# Patient Record
Sex: Female | Born: 1945 | Race: White | Hispanic: No | Marital: Married | Smoking: Never smoker
Health system: Southern US, Community
[De-identification: ages and names within clinical notes are randomized; demographics above are authoritative.]

## PROBLEM LIST (undated history)

## (undated) DIAGNOSIS — K259 Gastric ulcer, unspecified as acute or chronic, without hemorrhage or perforation: Secondary | ICD-10-CM

## (undated) DIAGNOSIS — G43909 Migraine, unspecified, not intractable, without status migrainosus: Secondary | ICD-10-CM

## (undated) DIAGNOSIS — S62109A Fracture of unspecified carpal bone, unspecified wrist, initial encounter for closed fracture: Secondary | ICD-10-CM

## (undated) HISTORY — PX: CATARACT EXTRACTION: SUR2

## (undated) HISTORY — DX: Migraine, unspecified, not intractable, without status migrainosus: G43.909

## (undated) HISTORY — DX: Fracture of unspecified carpal bone, unspecified wrist, initial encounter for closed fracture: S62.109A

---

## 2002-10-14 ENCOUNTER — Ambulatory Visit (HOSPITAL_COMMUNITY): Admission: RE | Admit: 2002-10-14 | Discharge: 2002-10-14 | Payer: Self-pay | Admitting: Gastroenterology

## 2013-12-05 DIAGNOSIS — N952 Postmenopausal atrophic vaginitis: Secondary | ICD-10-CM | POA: Insufficient documentation

## 2015-04-12 ENCOUNTER — Encounter: Payer: Self-pay | Admitting: Physical Therapy

## 2015-04-12 ENCOUNTER — Ambulatory Visit: Payer: Medicare HMO | Attending: Orthopedic Surgery | Admitting: Physical Therapy

## 2015-04-12 DIAGNOSIS — M62838 Other muscle spasm: Secondary | ICD-10-CM

## 2015-04-12 DIAGNOSIS — M6248 Contracture of muscle, other site: Secondary | ICD-10-CM | POA: Diagnosis present

## 2015-04-12 DIAGNOSIS — M542 Cervicalgia: Secondary | ICD-10-CM | POA: Insufficient documentation

## 2015-04-12 DIAGNOSIS — M5030 Other cervical disc degeneration, unspecified cervical region: Secondary | ICD-10-CM

## 2015-04-12 NOTE — Therapy (Signed)
[facility] [address] [place], [place], [address] Phone: [phone]   Fax:  [phone]  Physical Therapy Evaluation  Patient Details  Name: Kelsey Webb MRN: [id number] Date of Birth: [birth year] Referring Provider: Ranell Patrick  Encounter Date: 04/12/2015      PT End of Session - 04/12/15 1004    Visit Number 1   Date for PT Re-Evaluation 06/10/15   PT Start Time 0925   PT Stop Time 1020   PT Time Calculation (min) 55 min   Activity Tolerance Patient tolerated treatment well   Behavior During Therapy [facility] for tasks assessed/performed      History reviewed. No pertinent past medical history.  History reviewed. No pertinent past surgical history.  There were no vitals filed for this visit.  Visit Diagnosis:  Neck pain - Plan: PT plan of care cert/re-cert  Trapezius muscle spasm - Plan: PT plan of care cert/re-cert  DDD (degenerative disc disease), cervical - Plan: PT plan of care cert/re-cert      Subjective Assessment - 04/12/15 0927    Subjective Reports that her x-rays show DDD, and spurs.  Patient reports that she likes to garden and got a job in a [employer] last summer and really feels like she over did it and has had some pain since.   Limitations Sitting;Reading;House hold activities   Diagnostic tests DDD, spurs   Patient Stated Goals have less pain   Currently in Pain? Yes   Pain Score 3    Pain Location Neck   Pain Orientation Right;Upper   Pain Descriptors / Indicators Aching;Spasm;Sore   Pain Type Chronic pain   Pain Onset More than a month ago   Pain Frequency Constant   Aggravating Factors  increased activity, pain up to 8-9/10   Pain Relieving Factors reports she has found nothing that helps pain            [facility] PT Assessment - 04/12/15 0001    Assessment   Medical Diagnosis neck pain   Referring Provider Ranell Patrick   Onset Date/Surgical Date 04/02/15   Hand Dominance Right   Prior Therapy none   Precautions   Precautions None   Balance Screen   Has the patient fallen in the past 6 months No   Has the patient had a decrease in activity level because of a fear of falling?  No   Is the patient reluctant to leave their home because of a fear of falling?  No   Home Environment   Additional Comments does housework and yardwork   Prior Function   Level of Independence Independent   Vocation Retired   Leisure love to garden, was doing some exercise until December   Posture/Postural Control   Posture Comments fwd head, rounded shoulders   AROM   Overall AROM Comments Cervical ROM 50% decreased for flexion and side bending, rotation was decreased 25%, extension WNL's, shoulder ROM WNL's   Strength   Overall Strength Comments 4/5 for the shoulders   Flexibility   Soft Tissue Assessment /Muscle Length --  mild tightness in the right UE neural tension   Palpation   Palpation comment she is very tight and tender in the right cervical area and into the right upper trap and rhomboid   Special Tests    Special Tests --  less pain with manual cervical distraction  [facility] Adult PT Treatment/Exercise - 2015/04/21 0001    Modalities   Modalities Moist Heat;Electrical Stimulation   Moist Heat Therapy   Number Minutes Moist Heat 15 Minutes   Moist Heat Location Cervical   Electrical Stimulation   Electrical Stimulation Location right cervical area   Electrical Stimulation Action IFC   Electrical Stimulation Parameters supine   Electrical Stimulation Goals Pain                PT Education - 21-Apr-2015 1003    Education provided Yes   Education Details shrugs, scapular and cervical retraction   Person(s) Educated Patient   Methods Explanation;Demonstration;Handout   Comprehension Verbalized understanding;Returned demonstration          PT Short Term Goals - 04-21-2015 1051    PT SHORT TERM GOAL #1   Title independent with  initial HEP   Time 1   Period Weeks   Status New           PT Long Term Goals - April 21, 2015 1051    PT LONG TERM GOAL #1   Title report 50% less pain   Time 8   Period Weeks   Status New   PT LONG TERM GOAL #2   Title have 25% increased ROM   Time 8   Period Weeks   Status New   PT LONG TERM GOAL #3   Title return to the gym safely   Time 8   Period Weeks   Status New   PT LONG TERM GOAL #4   Title report less HA   Time 8   Period Weeks   Status New               Plan - 2015-04-21 1009    Clinical Impression Statement Patient with right side neck pain since overdoing it lifting last summer, x-rays show DDD and some bone spurs.  She has pain up to the occiput, very tight in the mms of the cervical area, the upper trap and into the rhomboids.  Manual cervical distraction decreased the pain   Pt will benefit from skilled therapeutic intervention in order to improve on the following deficits Decreased range of motion;Decreased strength;Increased muscle spasms;Postural dysfunction;Improper body mechanics;Pain   Rehab Potential Good   PT Frequency 2x / week   PT Duration 8 weeks   PT Treatment/Interventions Moist Heat;Therapeutic exercise;Manual techniques;Taping;Electrical Stimulation;Cryotherapy;Patient/family education;Ultrasound;Traction   PT Next Visit Plan may try traction   Consulted and Agree with Plan of Care Patient          G-Codes - 04-21-15 1053    Functional Assessment Tool Used foto 62% limitation   Functional Limitation Carrying, moving and handling objects   Carrying, Moving and Handling Objects Current Status (Z6109) At least 60 percent but less than 80 percent impaired, limited or restricted   Carrying, Moving and Handling Objects Goal Status (U0454) At least 40 percent but less than 60 percent impaired, limited or restricted       Problem List There are no active problems to display for this patient.   Jearld Lesch., PT 04-21-15,  10:55 AM  [facility] [address] [place], [place], [address] Phone: [phone]   Fax:  [phone]  Name: Kelsey Webb MRN: [id number] Date of Birth: 03-May-1945

## 2015-04-19 ENCOUNTER — Ambulatory Visit: Payer: Medicare HMO | Admitting: Physical Therapy

## 2015-04-19 ENCOUNTER — Encounter: Payer: Self-pay | Admitting: Physical Therapy

## 2015-04-19 DIAGNOSIS — M62838 Other muscle spasm: Secondary | ICD-10-CM

## 2015-04-19 DIAGNOSIS — M542 Cervicalgia: Secondary | ICD-10-CM | POA: Diagnosis not present

## 2015-04-19 NOTE — Therapy (Signed)
[facility] [place] [address] [place], [place], [address] Phone: [phone]   Fax:  [phone]  Physical Therapy Treatment  Patient Details  Name: Kelsey Webb MRN: [id number] Date of Birth: 01-01-1946 Referring Provider: Ranell Patrick  Encounter Date: 04/19/2015      PT End of Session - 04/19/15 1006    Visit Number 2   Date for PT Re-Evaluation 06/10/15   PT Start Time 0930   PT Stop Time 1012   PT Time Calculation (min) 42 min      History reviewed. No pertinent past medical history.  History reviewed. No pertinent past surgical history.  There were no vitals filed for this visit.  Visit Diagnosis:  Neck pain  Trapezius muscle spasm      Subjective Assessment - 04/19/15 0924    Subjective theraband HEP really increases pain, other ex are okay   Currently in Pain? Yes   Pain Score 3    Pain Location Neck   Pain Orientation Right                         [facility] Adult PT Treatment/Exercise - 04/19/15 0001    Exercises   Exercises Neck   Neck Exercises: Machines for Strengthening   UBE (Upper Arm Bike) 2 fwd/2 back   Neck Exercises: Standing   Neck Retraction 10 reps;3 secs  with ball on wall   Wall Push Ups 10 reps  ball vs wall 5 CC and CW   Other Standing Exercises postural reset 10 times   Electrical Stimulation   Electrical Stimulation Location right cervical area/UT/rhom   Electrical Stimulation Action combo   Manual Therapy   Manual Therapy Manual Traction;Joint mobilization;Soft tissue mobilization;Passive ROM;Taping   Manual therapy comments kinesio tape to creat space   Soft tissue mobilization UT/rhom/cerv   Passive ROM cerv ROM   Manual Traction cerv                PT Education - 04/19/15 1006    Education provided Yes   Education Details D/C tband,work on postural reste and stab\   Person(s) Educated Patient   Methods Explanation;Demonstration   Comprehension  Verbalized understanding;Returned demonstration          PT Short Term Goals - 04/19/15 1008    PT SHORT TERM GOAL #1   Title independent with initial HEP   Status Achieved           PT Long Term Goals - 04/12/15 1051    PT LONG TERM GOAL #1   Title report 50% less pain   Time 8   Period Weeks   Status New   PT LONG TERM GOAL #2   Title have 25% increased ROM   Time 8   Period Weeks   Status New   PT LONG TERM GOAL #3   Title return to the gym safely   Time 8   Period Weeks   Status New   PT LONG TERM GOAL #4   Title report less HA   Time 8   Period Weeks   Status New               Plan - 04/19/15 1007    Clinical Impression Statement increased tightness and trigger pts in UT and rhom,responded well to STW and combo-trial of tape. Adjusted HEP   PT Next Visit Plan assess and progress,possible dry needle rec if no changes overall  Problem List There are no active problems to display for this patient.   Sita Mangen,ANGIE PTA 04/19/2015, 10:09 AM  [facility] [place] [address] [place], [place], [address] Phone: [phone]   Fax:  [phone]  Name: Trent Theisen MRN: [id number] Date of Birth: 02-20-1946

## 2015-04-26 ENCOUNTER — Ambulatory Visit: Payer: Medicare HMO | Attending: Orthopedic Surgery | Admitting: Physical Therapy

## 2015-04-26 ENCOUNTER — Encounter: Payer: Self-pay | Admitting: Physical Therapy

## 2015-04-26 DIAGNOSIS — M542 Cervicalgia: Secondary | ICD-10-CM | POA: Insufficient documentation

## 2015-04-26 DIAGNOSIS — M5030 Other cervical disc degeneration, unspecified cervical region: Secondary | ICD-10-CM

## 2015-04-26 DIAGNOSIS — M62838 Other muscle spasm: Secondary | ICD-10-CM

## 2015-04-26 DIAGNOSIS — M6248 Contracture of muscle, other site: Secondary | ICD-10-CM | POA: Diagnosis present

## 2015-04-26 NOTE — Therapy (Signed)
[facility] [place] [address] [place], [place], [address] Phone: [phone]   Fax:  [phone]  Physical Therapy Treatment  Patient Details  Name: Kelsey MillerChristina Berwanger MRN: [id number] Date of Birth: 12/02/1945 Referring Provider: Ranell PatrickNorris  Encounter Date: 04/26/2015      PT End of Session - 04/26/15 1010    Visit Number 3   Date for PT Re-Evaluation 06/10/15   PT Start Time 0930   PT Stop Time 1025   PT Time Calculation (min) 55 min   Activity Tolerance Patient tolerated treatment well   Behavior [facility] Therapy [facility] for tasks assessed/performed      History reviewed. No pertinent past medical history.  History reviewed. No pertinent past surgical history.  There were no vitals filed for this visit.  Visit Diagnosis:  Neck pain  Trapezius muscle spasm  DDD (degenerative disc disease), cervical      Subjective Assessment - 04/26/15 0930    Subjective "It is a working progress" Pt stated that the Tape really helped last time   Currently in Pain? Yes   Pain Score 4    Pain Location Neck   Pain Orientation Right                         [facility] Adult PT Treatment/Exercise - 04/26/15 0001    Exercises   Exercises Neck   Neck Exercises: Machines for Strengthening   UBE (Upper Arm Bike) 3 fwd/3 back   Neck Exercises: Standing   Wall Push Ups 10 reps   Other Standing Exercises Ball on wall CW/CCW x10; Shrugs #2 2x10   Other Standing Exercises OHP with red ball. 2x10   Neck Exercises: Seated   Other Seated Exercise Rows #10 2x10   Other Seated Exercise Lat #15 2x10    Neck Exercises: Supine   Other Supine Exercise Cerv retraction 3 way 2x10   Modalities   Modalities Moist Heat;Electrical Stimulation   Moist Heat Therapy   Number Minutes Moist Heat 15 Minutes   Moist Heat Location Cervical   Electrical Stimulation   Electrical Stimulation Location right cervical   Electrical Stimulation Action IFC    Electrical Stimulation Goals Pain   Manual Therapy   Manual Therapy Passive ROM;Soft tissue mobilization   Manual therapy comments kinesio tape to creat space   Soft tissue mobilization UT/rhom/cerv   Passive ROM cerv ROM   Manual Traction cerv                  PT Short Term Goals - 04/19/15 1008    PT SHORT TERM GOAL #1   Title independent with initial HEP   Status Achieved           PT Long Term Goals - 04/12/15 1051    PT LONG TERM GOAL #1   Title report 50% less pain   Time 8   Period Weeks   Status New   PT LONG TERM GOAL #2   Title have 25% increased ROM   Time 8   Period Weeks   Status New   PT LONG TERM GOAL #3   Title return to the gym safely   Time 8   Period Weeks   Status New   PT LONG TERM GOAL #4   Title report less HA   Time 8   Period Weeks   Status New  Plan - 04/26/15 1011    Clinical Impression Statement Completed exercises well, did not attempt Tband exercises but did perform machine exercises well. No increase in pain with exercises.    Pt will benefit from skilled therapeutic intervention in order to improve on the following deficits Decreased range of motion;Decreased strength;Increased muscle spasms;Postural dysfunction;Improper body mechanics;Pain   Rehab Potential Good   PT Frequency 2x / week   PT Duration 8 weeks   PT Treatment/Interventions Moist Heat;Therapeutic exercise;Manual techniques;Taping;Electrical Stimulation;Cryotherapy;Patient/family education;Ultrasound;Traction   PT Next Visit Plan assess and progress        Problem List There are no active problems to display for this patient.   Grayce Sessions, PTA 04/26/2015, 10:16 AM  [facility] [place] [address] [place], [place], [address] Phone: [phone]   Fax:  [phone]  Name: Carmina Walle MRN: [id number] Date of Birth: 1945-12-31

## 2015-05-03 ENCOUNTER — Ambulatory Visit: Payer: Medicare HMO | Admitting: Physical Therapy

## 2015-05-03 ENCOUNTER — Encounter: Payer: Self-pay | Admitting: Physical Therapy

## 2015-05-03 DIAGNOSIS — M542 Cervicalgia: Secondary | ICD-10-CM | POA: Diagnosis not present

## 2015-05-03 DIAGNOSIS — M5030 Other cervical disc degeneration, unspecified cervical region: Secondary | ICD-10-CM

## 2015-05-03 DIAGNOSIS — M62838 Other muscle spasm: Secondary | ICD-10-CM

## 2015-05-03 NOTE — Therapy (Signed)
[place], [place], [address] Phone: [phone]   Fax:  [phone]  Physical Therapy Treatment  Patient Details  Name: Kelsey Webb MRN: [id number] Date of Birth: 1945/03/01 Referring Provider: Veverly Fells  Encounter Date: 05/03/2015      PT End of Session - 05/03/15 1006    Visit Number 4   Date for PT Re-Evaluation 06/10/15   PT Start Time 0929   PT Stop Time 1009   PT Time Calculation (min) 40 min   Activity Tolerance Patient tolerated treatment well   Behavior During Therapy [facility] for tasks assessed/performed      History reviewed. No pertinent past medical history.  History reviewed. No pertinent past surgical history.  There were no vitals filed for this visit.  Visit Diagnosis:  Neck pain  Trapezius muscle spasm  DDD (degenerative disc disease), cervical      Subjective Assessment - 05/03/15 0927    Subjective "Ok ,the tape helped" Pt stated that she will have to stop PT after this date because her husband is requiring some extra care. .    Currently in Pain? Yes   Pain Score 3    Pain Location Neck   Pain Orientation Right   Pain Descriptors / Indicators Aching                         [facility] Adult PT Treatment/Exercise - 05/03/15 0001    Exercises   Exercises Neck   Neck Exercises: Machines for Strengthening   UBE (Upper Arm Bike) 3 fwd/3 back   Neck Exercises: Standing   Wall Push Ups 10 reps  2 sets    Other Standing Exercises Ball on wall CW/CCW x10; Shrugs #3 2x10   Other Standing Exercises OHP with yellow ball. 2x10   Neck Exercises: Seated   Other Seated Exercise Rows #10 2x10   Other Seated Exercise Lat #15 2x10    Neck Exercises: Supine   Other Supine Exercise Cerv retraction 3 way 2x10   Manual Therapy   Manual Therapy Passive ROM;Soft tissue mobilization   Manual therapy comments kinesio tape to creat space   Soft tissue mobilization  UT/rhom/cerv   Passive ROM cerv ROM   Manual Traction cerv                  PT Short Term Goals - 04/19/15 1008    PT SHORT TERM GOAL #1   Title independent with initial HEP   Status Achieved           PT Long Term Goals - 05/03/15 1011    PT LONG TERM GOAL #2   Title have 25% increased ROM   Status Achieved   PT LONG TERM GOAL #3   Title return to the gym safely               Plan - 05/03/15 1007    Clinical Impression Statement Pt tolerated treatment well, Positive response with MT and tapping. Pt reports that she needs to be D/C due to her husbands increased needs.   Pt will benefit from skilled therapeutic intervention in order to improve on the following deficits Decreased range of motion;Decreased strength;Increased muscle spasms;Postural dysfunction;Improper body mechanics;Pain   Rehab Potential Good   PT Frequency 2x / week   PT Duration 8 weeks   PT Treatment/Interventions Moist Heat;Therapeutic exercise;Manual techniques;Taping;Electrical Stimulation;Cryotherapy;Patient/family education;Ultrasound;Traction   PT Next Visit Plan D/C PT  Problem List There are no active problems to display for this patient.  PHYSICAL THERAPY DISCHARGE SUMMARY  Visits from Start of Care: 4   Plan: Patient agrees to discharge.  Patient goals were partially met. Patient is being discharged due to being pleased with the current functional level.  ?????        Scot Jun, PTA  05/03/2015, 10:12 AM  [place] [place], [place], [address] Phone: [phone]   Fax:  [phone]  Name: Kelsey Webb MRN: [id number] Date of Birth: December 06, 1945

## 2016-06-05 DIAGNOSIS — H33321 Round hole, right eye: Secondary | ICD-10-CM | POA: Diagnosis not present

## 2016-06-05 DIAGNOSIS — H35341 Macular cyst, hole, or pseudohole, right eye: Secondary | ICD-10-CM | POA: Diagnosis not present

## 2016-07-05 DIAGNOSIS — Z0100 Encounter for examination of eyes and vision without abnormal findings: Secondary | ICD-10-CM | POA: Diagnosis not present

## 2016-10-05 DIAGNOSIS — R69 Illness, unspecified: Secondary | ICD-10-CM | POA: Diagnosis not present

## 2016-12-13 DIAGNOSIS — R69 Illness, unspecified: Secondary | ICD-10-CM | POA: Diagnosis not present

## 2017-01-04 DIAGNOSIS — H35371 Puckering of macula, right eye: Secondary | ICD-10-CM | POA: Diagnosis not present

## 2017-01-04 DIAGNOSIS — H35341 Macular cyst, hole, or pseudohole, right eye: Secondary | ICD-10-CM | POA: Diagnosis not present

## 2017-01-04 DIAGNOSIS — H33321 Round hole, right eye: Secondary | ICD-10-CM | POA: Diagnosis not present

## 2017-02-27 DIAGNOSIS — R69 Illness, unspecified: Secondary | ICD-10-CM | POA: Diagnosis not present

## 2017-05-21 DIAGNOSIS — H35341 Macular cyst, hole, or pseudohole, right eye: Secondary | ICD-10-CM | POA: Diagnosis not present

## 2017-05-21 DIAGNOSIS — H2513 Age-related nuclear cataract, bilateral: Secondary | ICD-10-CM | POA: Diagnosis not present

## 2017-05-21 DIAGNOSIS — H25013 Cortical age-related cataract, bilateral: Secondary | ICD-10-CM | POA: Diagnosis not present

## 2017-11-20 DIAGNOSIS — R69 Illness, unspecified: Secondary | ICD-10-CM | POA: Diagnosis not present

## 2018-03-05 DIAGNOSIS — H25013 Cortical age-related cataract, bilateral: Secondary | ICD-10-CM | POA: Diagnosis not present

## 2018-03-05 DIAGNOSIS — H35341 Macular cyst, hole, or pseudohole, right eye: Secondary | ICD-10-CM | POA: Diagnosis not present

## 2018-03-05 DIAGNOSIS — H2513 Age-related nuclear cataract, bilateral: Secondary | ICD-10-CM | POA: Diagnosis not present

## 2018-04-25 DIAGNOSIS — H25013 Cortical age-related cataract, bilateral: Secondary | ICD-10-CM | POA: Diagnosis not present

## 2018-04-25 DIAGNOSIS — H2511 Age-related nuclear cataract, right eye: Secondary | ICD-10-CM | POA: Diagnosis not present

## 2018-04-25 DIAGNOSIS — H25043 Posterior subcapsular polar age-related cataract, bilateral: Secondary | ICD-10-CM | POA: Diagnosis not present

## 2018-04-25 DIAGNOSIS — H2513 Age-related nuclear cataract, bilateral: Secondary | ICD-10-CM | POA: Diagnosis not present

## 2018-04-25 DIAGNOSIS — H02831 Dermatochalasis of right upper eyelid: Secondary | ICD-10-CM | POA: Diagnosis not present

## 2018-04-25 DIAGNOSIS — H18413 Arcus senilis, bilateral: Secondary | ICD-10-CM | POA: Diagnosis not present

## 2018-10-01 DIAGNOSIS — R69 Illness, unspecified: Secondary | ICD-10-CM | POA: Diagnosis not present

## 2018-11-03 DIAGNOSIS — R69 Illness, unspecified: Secondary | ICD-10-CM | POA: Diagnosis not present

## 2019-01-02 DIAGNOSIS — H25013 Cortical age-related cataract, bilateral: Secondary | ICD-10-CM | POA: Diagnosis not present

## 2019-01-02 DIAGNOSIS — H2511 Age-related nuclear cataract, right eye: Secondary | ICD-10-CM | POA: Diagnosis not present

## 2019-01-02 DIAGNOSIS — H35371 Puckering of macula, right eye: Secondary | ICD-10-CM | POA: Diagnosis not present

## 2019-01-02 DIAGNOSIS — H2513 Age-related nuclear cataract, bilateral: Secondary | ICD-10-CM | POA: Diagnosis not present

## 2019-01-02 DIAGNOSIS — H25043 Posterior subcapsular polar age-related cataract, bilateral: Secondary | ICD-10-CM | POA: Diagnosis not present

## 2019-01-02 DIAGNOSIS — H18413 Arcus senilis, bilateral: Secondary | ICD-10-CM | POA: Diagnosis not present

## 2019-01-27 DIAGNOSIS — H2511 Age-related nuclear cataract, right eye: Secondary | ICD-10-CM | POA: Diagnosis not present

## 2019-02-28 DIAGNOSIS — Z0100 Encounter for examination of eyes and vision without abnormal findings: Secondary | ICD-10-CM | POA: Diagnosis not present

## 2019-04-03 ENCOUNTER — Encounter (HOSPITAL_BASED_OUTPATIENT_CLINIC_OR_DEPARTMENT_OTHER): Payer: Self-pay

## 2019-04-03 ENCOUNTER — Emergency Department (HOSPITAL_BASED_OUTPATIENT_CLINIC_OR_DEPARTMENT_OTHER)
Admission: EM | Admit: 2019-04-03 | Discharge: 2019-04-03 | Disposition: A | Discharge status: Home / Self Care | Payer: Medicare HMO | Attending: Emergency Medicine | Admitting: Emergency Medicine

## 2019-04-03 ENCOUNTER — Other Ambulatory Visit: Payer: Self-pay

## 2019-04-03 ENCOUNTER — Emergency Department (HOSPITAL_BASED_OUTPATIENT_CLINIC_OR_DEPARTMENT_OTHER): Payer: Medicare HMO

## 2019-04-03 DIAGNOSIS — Z8711 Personal history of peptic ulcer disease: Secondary | ICD-10-CM | POA: Insufficient documentation

## 2019-04-03 DIAGNOSIS — R1013 Epigastric pain: Secondary | ICD-10-CM

## 2019-04-03 DIAGNOSIS — R1032 Left lower quadrant pain: Secondary | ICD-10-CM | POA: Diagnosis not present

## 2019-04-03 DIAGNOSIS — R197 Diarrhea, unspecified: Secondary | ICD-10-CM | POA: Diagnosis not present

## 2019-04-03 HISTORY — DX: Gastric ulcer, unspecified as acute or chronic, without hemorrhage or perforation: K25.9

## 2019-04-03 LAB — CBC
HCT: 46.5 % — ABNORMAL HIGH (ref 36.0–46.0)
Hemoglobin: 15.3 g/dL — ABNORMAL HIGH (ref 12.0–15.0)
MCH: 30.8 pg (ref 26.0–34.0)
MCHC: 32.9 g/dL (ref 30.0–36.0)
MCV: 93.6 fL (ref 80.0–100.0)
Platelets: 290 10*3/uL (ref 150–400)
RBC: 4.97 MIL/uL (ref 3.87–5.11)
RDW: 13 % (ref 11.5–15.5)
WBC: 4.7 10*3/uL (ref 4.0–10.5)
nRBC: 0 % (ref 0.0–0.2)

## 2019-04-03 LAB — COMPREHENSIVE METABOLIC PANEL
ALT: 18 U/L (ref 0–44)
AST: 25 U/L (ref 15–41)
Albumin: 4.1 g/dL (ref 3.5–5.0)
Alkaline Phosphatase: 73 U/L (ref 38–126)
Anion gap: 8 (ref 5–15)
BUN: 14 mg/dL (ref 8–23)
CO2: 28 mmol/L (ref 22–32)
Calcium: 9.3 mg/dL (ref 8.9–10.3)
Chloride: 100 mmol/L (ref 98–111)
Creatinine, Ser: 0.93 mg/dL (ref 0.44–1.00)
GFR calc Af Amer: >60 mL/min (ref >60)
GFR calc non Af Amer: >60 mL/min (ref >60)
Glucose, Bld: 92 mg/dL (ref 70–99)
Potassium: 4.1 mmol/L (ref 3.5–5.1)
Sodium: 136 mmol/L (ref 135–145)
Total Bilirubin: 0.9 mg/dL (ref 0.3–1.2)
Total Protein: 7.5 g/dL (ref 6.5–8.1)

## 2019-04-03 LAB — LIPASE, BLOOD: Lipase: 47 U/L (ref 11–51)

## 2019-04-03 MED ORDER — SUCRALFATE 1 G PO TABS: 1.0000 g | ORAL_TABLET | Freq: Four times a day (QID) | ORAL | 0 refills | Status: DC | PRN

## 2019-04-03 MED ORDER — ALUM & MAG HYDROXIDE-SIMETH 200-200-20 MG/5ML PO SUSP
30.0000 mL | Freq: Once | ORAL | Status: AC
  Administered 2019-04-03: 12:00:00 30 mL via ORAL
  Filled 2019-04-03: qty 30

## 2019-04-03 MED ORDER — LIDOCAINE VISCOUS HCL 2 % MT SOLN
15.0000 mL | Freq: Once | OROMUCOSAL | Status: AC
  Administered 2019-04-03: 12:00:00 15 mL via ORAL
  Filled 2019-04-03: qty 15

## 2019-04-03 NOTE — ED Triage Notes (Signed)
Pt c/o abd pain "that feels like a ulcer" x 2 days-NAD-steady gait

## 2019-04-03 NOTE — ED Provider Notes (Signed)
[facility] Emergency Department Provider Note MRN:  [id number]  Arrival date & time: 04/03/19     Chief Complaint   Abdominal Pain   History of Present Illness   Kelsey Webb is a 74 y.o. year-old female with a history of gastric ulcer presenting to the ED with chief complaint of abdominal pain.  Location: Epigastrium radiating diffusely Duration: 2 days Onset: Sudden Timing: Constant Description: Sharp Severity: Moderate to severe Exacerbating/Alleviating Factors: None Associated Symptoms: None Pertinent Negatives:   Denies lightheadedness, no headache, no chest pain, no shortness of breath, no vomiting, no diarrhea, no blood in the stool, no black stool, no dysuria    Review of Systems  A complete 10 system review of systems was obtained and all systems are negative except as noted in the HPI and PMH.   Patient's Health History    Past Medical History:  Diagnosis Date  . Gastric ulcer     Past Surgical History:  Procedure Laterality Date  . CATARACT EXTRACTION      No family history on file.  Social History   Socioeconomic History  . Marital status: Married    Spouse name: Not on file  . Number of children: Not on file  . Years of education: Not on file  . Highest education level: Not on file  Occupational History  . Not on file  Tobacco Use  . Smoking status: Never Smoker  . Smokeless tobacco: Never Used  Substance and Sexual Activity  . Alcohol use: Not Currently  . Drug use: Never  . Sexual activity: Not on file  Other Topics Concern  . Not on file  Social History Narrative  . Not on file   Social Determinants of Health   Financial Resource Strain:   . Difficulty of Paying Living Expenses: Not on file  Food Insecurity:   . Worried About Charity fundraiser in the Last Year: Not on file  . Ran Out of Food in the Last Year: Not on file  Transportation Needs:   . Lack of Transportation (Medical): Not on file  .  Lack of Transportation (Non-Medical): Not on file  Physical Activity:   . Days of Exercise per Week: Not on file  . Minutes of Exercise per Session: Not on file  Stress:   . Feeling of Stress : Not on file  Social Connections:   . Frequency of Communication with Friends and Family: Not on file  . Frequency of Social Gatherings with Friends and Family: Not on file  . Attends Religious Services: Not on file  . Active Member of Clubs or Organizations: Not on file  . Attends Archivist Meetings: Not on file  . Marital Status: Not on file  Intimate Partner Violence:   . Fear of Current or Ex-Partner: Not on file  . Emotionally Abused: Not on file  . Physically Abused: Not on file  . Sexually Abused: Not on file     Physical Exam   Vitals:   04/03/19 1118  BP: (!) 150/91  Pulse: 69  Resp: 20  Temp: 98.2 F (36.8 C)  SpO2: 100%    CONSTITUTIONAL: Well-appearing, NAD NEURO:  Alert and oriented x 3, no focal deficits EYES:  eyes equal and reactive ENT/NECK:  no LAD, no JVD CARDIO: Regular rate, well-perfused, normal S1 and S2 PULM:  CTAB no wheezing or rhonchi GI/GU:  normal bowel sounds, non-distended, mild epigastric tenderness to palpation MSK/SPINE:  No gross deformities, no edema  SKIN:  no rash, atraumatic PSYCH:  Appropriate speech and behavior  *Additional and/or pertinent findings included in MDM below  Diagnostic and Interventional Summary    EKG Interpretation  Date/Time:  Thursday April 03 2019 11:55:06 EST Ventricular Rate:  63 PR Interval:    QRS Duration: 79 QT Interval:  405 QTC Calculation: 415 R Axis:   71 Text Interpretation: Sinus rhythm Abnormal R-wave progression, early transition No previous ECGs available Confirmed by Kennis Carina [phone]) on 04/03/2019 12:22:00 PM      Cardiac Monitoring Interpretation:  Labs Reviewed  CBC - Abnormal; Notable for the following components:      Result Value   Hemoglobin 15.3 (*)    HCT 46.5  (*)    All other components within normal limits  COMPREHENSIVE METABOLIC PANEL  LIPASE, BLOOD    DG ABD ACUTE 2+V W 1V CHEST  Final Result      Medications  alum & mag hydroxide-simeth (MAALOX/MYLANTA) 200-200-20 MG/5ML suspension 30 mL (30 mLs Oral Given 04/03/19 1207)    And  lidocaine (XYLOCAINE) 2 % viscous mouth solution 15 mL (15 mLs Oral Given 04/03/19 1207)     Procedures  /  Critical Care Procedures  ED Course and Medical Decision Making  I have reviewed the triage vital signs, the nursing notes, and pertinent available records from the EMR.  Pertinent labs & imaging results that were available during my care of the patient were reviewed by me and considered in my medical decision making (see below for details).     History of gastric ulcer, patient explains she needed to be admitted for this issue at [facility] hospital and required blood transfusion at that time.  This was several years ago.  She currently does not take any PPI or other preventative medications.  Took omeprazole today without help.  Abdomen is largely reassuring, minimally tender, reassuring vital signs, doubt perforation or acute surgical process.  Obtaining screening labs and x-ray and will reassess.  12:30 PM update: Work-up is reassuring, no leukocytosis, no anemia, normal lipase, normal x-ray.  Patient is feeling better after GI cocktail.  Abdominal exam is again soft and largely nontender.  No indication for advanced imaging today, appropriate for better symptomatic management and PCP follow-up.  [name]. Pilar Plate, MD [facility] Emergency [facility] [email or web address]  Final Clinical Impressions(s) / ED Diagnoses     ICD-10-CM   1. Epigastric pain  R10.13 DG ABD ACUTE 2+V W 1V CHEST    DG ABD ACUTE 2+V W 1V CHEST    ED Discharge Orders         Ordered    sucralfate (CARAFATE) 1 g tablet  4 times daily PRN     04/03/19 1229           Discharge  Instructions Discussed with and Provided to Patient:     Discharge Instructions     You were evaluated in the Emergency Department and after careful evaluation, we did not find any emergent condition requiring admission or further testing in the hospital.  Your exam/testing today is overall reassuring.  As discussed, please take the omeprazole daily and use the Carafate throughout the day as needed.  Follow-up with primary care doctor.  Please return to the Emergency Department if you experience any worsening of your condition.  We encourage you to follow up with a primary care provider.  Thank you for allowing [place] to be a part of your care.  Sabas Sous, MD 04/03/19 1230

## 2019-04-03 NOTE — Discharge Instructions (Signed)
You were evaluated in the Emergency Department and after careful evaluation, we did not find any emergent condition requiring admission or further testing in the hospital.  Your exam/testing today is overall reassuring.  As discussed, please take the omeprazole daily and use the Carafate throughout the day as needed.  Follow-up with primary care doctor.  Please return to the Emergency Department if you experience any worsening of your condition.  We encourage you to follow up with a primary care provider.  Thank you for allowing [place] to be a part of your care.

## 2019-04-14 DIAGNOSIS — R066 Hiccough: Secondary | ICD-10-CM | POA: Diagnosis not present

## 2019-04-14 DIAGNOSIS — R1030 Lower abdominal pain, unspecified: Secondary | ICD-10-CM | POA: Diagnosis not present

## 2019-04-27 ENCOUNTER — Ambulatory Visit: Payer: Medicare HMO | Attending: Internal Medicine

## 2019-04-27 DIAGNOSIS — Z23 Encounter for immunization: Secondary | ICD-10-CM

## 2019-04-27 NOTE — Progress Notes (Signed)
   Covid-19 Vaccination Clinic  Name:  Kelsey Webb    MRN: [id number] DOB: 13-Dec-1945  04/27/2019  [name] was observed post Covid-19 immunization for 15 minutes without incident. She was provided with Vaccine Information Sheet and instruction to access the V-Safe system.   [name] was instructed to call 911 with any severe reactions post vaccine: Marland Kitchen Difficulty breathing  . Swelling of face and throat  . A fast heartbeat  . A bad rash all over body  . Dizziness and weakness   Immunizations Administered    Name Date Dose VIS Date Route   Pfizer COVID-19 Vaccine 04/27/2019  9:47 AM 0.3 mL 01/31/2019 Intramuscular   Manufacturer: [employer], [employer]   Lot: [id number]   NDC: [address]-7

## 2019-05-28 ENCOUNTER — Ambulatory Visit: Payer: Medicare HMO | Attending: Internal Medicine

## 2019-05-28 DIAGNOSIS — Z23 Encounter for immunization: Secondary | ICD-10-CM

## 2019-05-28 NOTE — Progress Notes (Signed)
   Covid-19 Vaccination Clinic  Name:  Kelsey Webb    MRN: [id number] DOB: January 13, 1946  05/28/2019  [name] was observed post Covid-19 immunization for 15 minutes without incident. She was provided with Vaccine Information Sheet and instruction to access the V-Safe system.   [name] was instructed to call 911 with any severe reactions post vaccine: Marland Kitchen Difficulty breathing  . Swelling of face and throat  . A fast heartbeat  . A bad rash all over body  . Dizziness and weakness   Immunizations Administered    Name Date Dose VIS Date Route   Pfizer COVID-19 Vaccine 05/28/2019  9:47 AM 0.3 mL 01/31/2019 Intramuscular   Manufacturer: [employer], [employer]   Lot: [id number]   NDC: [address]-2

## 2019-06-20 DIAGNOSIS — Z01419 Encounter for gynecological examination (general) (routine) without abnormal findings: Secondary | ICD-10-CM | POA: Diagnosis not present

## 2019-10-24 DIAGNOSIS — H2512 Age-related nuclear cataract, left eye: Secondary | ICD-10-CM | POA: Diagnosis not present

## 2019-10-24 DIAGNOSIS — Z961 Presence of intraocular lens: Secondary | ICD-10-CM | POA: Diagnosis not present

## 2019-10-24 DIAGNOSIS — H35341 Macular cyst, hole, or pseudohole, right eye: Secondary | ICD-10-CM | POA: Diagnosis not present

## 2019-11-03 DIAGNOSIS — Z0101 Encounter for examination of eyes and vision with abnormal findings: Secondary | ICD-10-CM | POA: Diagnosis not present

## 2019-12-06 DIAGNOSIS — M19031 Primary osteoarthritis, right wrist: Secondary | ICD-10-CM | POA: Diagnosis not present

## 2019-12-06 DIAGNOSIS — W109XXA Fall (on) (from) unspecified stairs and steps, initial encounter: Secondary | ICD-10-CM | POA: Diagnosis not present

## 2019-12-06 DIAGNOSIS — S80212A Abrasion, left knee, initial encounter: Secondary | ICD-10-CM | POA: Diagnosis not present

## 2019-12-06 DIAGNOSIS — S80211A Abrasion, right knee, initial encounter: Secondary | ICD-10-CM | POA: Diagnosis not present

## 2019-12-06 DIAGNOSIS — M25531 Pain in right wrist: Secondary | ICD-10-CM | POA: Diagnosis not present

## 2019-12-06 DIAGNOSIS — Z043 Encounter for examination and observation following other accident: Secondary | ICD-10-CM | POA: Diagnosis not present

## 2019-12-06 DIAGNOSIS — M1712 Unilateral primary osteoarthritis, left knee: Secondary | ICD-10-CM | POA: Diagnosis not present

## 2019-12-06 DIAGNOSIS — S8392XA Sprain of unspecified site of left knee, initial encounter: Secondary | ICD-10-CM | POA: Diagnosis not present

## 2019-12-08 DIAGNOSIS — S83512A Sprain of anterior cruciate ligament of left knee, initial encounter: Secondary | ICD-10-CM | POA: Diagnosis not present

## 2019-12-08 DIAGNOSIS — S60211A Contusion of right wrist, initial encounter: Secondary | ICD-10-CM | POA: Diagnosis not present

## 2019-12-11 DIAGNOSIS — M25532 Pain in left wrist: Secondary | ICD-10-CM | POA: Diagnosis not present

## 2019-12-11 DIAGNOSIS — M25522 Pain in left elbow: Secondary | ICD-10-CM | POA: Diagnosis not present

## 2019-12-11 DIAGNOSIS — M25531 Pain in right wrist: Secondary | ICD-10-CM | POA: Diagnosis not present

## 2019-12-11 DIAGNOSIS — M25562 Pain in left knee: Secondary | ICD-10-CM | POA: Diagnosis not present

## 2019-12-16 DIAGNOSIS — R69 Illness, unspecified: Secondary | ICD-10-CM | POA: Diagnosis not present

## 2019-12-23 DIAGNOSIS — M25561 Pain in right knee: Secondary | ICD-10-CM | POA: Diagnosis not present

## 2019-12-25 DIAGNOSIS — M25562 Pain in left knee: Secondary | ICD-10-CM | POA: Diagnosis not present

## 2019-12-25 DIAGNOSIS — M25531 Pain in right wrist: Secondary | ICD-10-CM | POA: Diagnosis not present

## 2019-12-31 DIAGNOSIS — R69 Illness, unspecified: Secondary | ICD-10-CM | POA: Diagnosis not present

## 2020-01-08 DIAGNOSIS — M4722 Other spondylosis with radiculopathy, cervical region: Secondary | ICD-10-CM | POA: Diagnosis not present

## 2020-01-27 DIAGNOSIS — R69 Illness, unspecified: Secondary | ICD-10-CM | POA: Diagnosis not present

## 2020-02-05 DIAGNOSIS — M4692 Unspecified inflammatory spondylopathy, cervical region: Secondary | ICD-10-CM | POA: Diagnosis not present

## 2020-02-05 DIAGNOSIS — M4722 Other spondylosis with radiculopathy, cervical region: Secondary | ICD-10-CM | POA: Diagnosis not present

## 2020-02-05 DIAGNOSIS — M542 Cervicalgia: Secondary | ICD-10-CM | POA: Diagnosis not present

## 2020-02-05 DIAGNOSIS — M25562 Pain in left knee: Secondary | ICD-10-CM | POA: Diagnosis not present

## 2020-03-15 DIAGNOSIS — Z23 Encounter for immunization: Secondary | ICD-10-CM | POA: Diagnosis not present

## 2020-03-15 DIAGNOSIS — R8290 Unspecified abnormal findings in urine: Secondary | ICD-10-CM | POA: Diagnosis not present

## 2020-03-15 DIAGNOSIS — N3000 Acute cystitis without hematuria: Secondary | ICD-10-CM | POA: Diagnosis not present

## 2020-03-18 ENCOUNTER — Other Ambulatory Visit: Payer: Self-pay | Admitting: Family

## 2020-03-18 ENCOUNTER — Other Ambulatory Visit: Payer: Self-pay | Admitting: Cardiology

## 2020-03-18 DIAGNOSIS — R748 Abnormal levels of other serum enzymes: Secondary | ICD-10-CM

## 2020-03-19 ENCOUNTER — Ambulatory Visit
Admission: RE | Admit: 2020-03-19 | Discharge: 2020-03-19 | Disposition: A | Discharge status: Home / Self Care | Payer: Medicare HMO | Source: Ambulatory Visit | Attending: Family | Admitting: Family

## 2020-03-19 DIAGNOSIS — R748 Abnormal levels of other serum enzymes: Secondary | ICD-10-CM

## 2020-03-25 DIAGNOSIS — R7401 Elevation of levels of liver transaminase levels: Secondary | ICD-10-CM | POA: Diagnosis not present

## 2020-03-25 DIAGNOSIS — R945 Abnormal results of liver function studies: Secondary | ICD-10-CM | POA: Diagnosis not present

## 2020-04-15 DIAGNOSIS — R7401 Elevation of levels of liver transaminase levels: Secondary | ICD-10-CM | POA: Diagnosis not present

## 2020-04-21 DIAGNOSIS — R7401 Elevation of levels of liver transaminase levels: Secondary | ICD-10-CM | POA: Diagnosis not present

## 2020-04-21 DIAGNOSIS — T50905D Adverse effect of unspecified drugs, medicaments and biological substances, subsequent encounter: Secondary | ICD-10-CM | POA: Diagnosis not present

## 2020-05-25 ENCOUNTER — Telehealth: Payer: Self-pay

## 2020-05-25 NOTE — Telephone Encounter (Signed)
Appointment has been cancelled. Waiting for patient to call back.

## 2020-05-26 ENCOUNTER — Ambulatory Visit (INDEPENDENT_AMBULATORY_CARE_PROVIDER_SITE_OTHER): Payer: Medicare HMO | Admitting: Legal Medicine

## 2020-05-26 ENCOUNTER — Other Ambulatory Visit: Payer: Self-pay

## 2020-05-26 ENCOUNTER — Encounter: Payer: Self-pay | Admitting: Legal Medicine

## 2020-05-26 ENCOUNTER — Ambulatory Visit: Payer: Medicare HMO | Admitting: Family Medicine

## 2020-05-26 DIAGNOSIS — M1712 Unilateral primary osteoarthritis, left knee: Secondary | ICD-10-CM | POA: Insufficient documentation

## 2020-05-26 DIAGNOSIS — K250 Acute gastric ulcer with hemorrhage: Secondary | ICD-10-CM

## 2020-05-26 DIAGNOSIS — Z0000 Encounter for general adult medical examination without abnormal findings: Secondary | ICD-10-CM | POA: Diagnosis not present

## 2020-05-26 HISTORY — DX: Acute gastric ulcer with hemorrhage: K25.0

## 2020-05-26 NOTE — Progress Notes (Signed)
New Patient Office Visit  Subjective:  Patient ID: Kelsey Webb, female    DOB: 04-11-1945  Age: 75 y.o. MRN: [id number]  CC:  Chief Complaint  Patient presents with  . New Patient (Initial Visit)    Patient wants to establish with a new PCP.    HPI Kelsey Webb presents for establishment  Patient has no active problems but left knee ostearthritis, she is active in silver sneakers.She has no chronic diseases or problems.    Past Medical History:  Diagnosis Date  . Gastric ulcer   . Migraine   . Wrist fracture     Past Surgical History:  Procedure Laterality Date  . CATARACT EXTRACTION      Family History  Problem Relation Age of Onset  . Other Mother        aging  . Emphysema Father     Social History   Socioeconomic History  . Marital status: Married    Spouse name: Not on file  . Number of children: 2  . Years of education: Not on file  . Highest education level: Not on file  Occupational History  . Occupation: Retired  Tobacco Use  . Smoking status: Never Smoker  . Smokeless tobacco: Never Used  Vaping Use  . Vaping Use: Never used  Substance and Sexual Activity  . Alcohol use: Not Currently  . Drug use: Never  . Sexual activity: Not Currently  Other Topics Concern  . Not on file  Social History Narrative  . Not on file   Social Determinants of Health   Financial Resource Strain: Not on file  Food Insecurity: Not on file  Transportation Needs: Not on file  Physical Activity: Not on file  Stress: Not on file  Social Connections: Not on file  Intimate Partner Violence: Not on file    ROS Review of Systems  Constitutional: Negative for activity change and appetite change.  HENT: Negative for congestion and sinus pain.   Eyes: Negative for visual disturbance.  Respiratory: Negative for choking and shortness of breath.   Cardiovascular: Negative for chest pain, palpitations and leg swelling.  Gastrointestinal: Negative for abdominal  distention and abdominal pain.  Endocrine: Negative for polyuria.  Genitourinary: Negative for difficulty urinating, dyspareunia and urgency.  Musculoskeletal: Positive for arthralgias.  Skin: Negative.   Neurological: Negative.   Psychiatric/Behavioral: Negative.     Objective:   Today's Vitals: BP 130/70   Pulse 76   Temp 98.1 F (36.7 C)   Resp 16   Ht 5' 4.5" (1.638 m)   Wt 146 lb (66.2 kg)   SpO2 97%   BMI 24.67 kg/m   Physical Exam Vitals reviewed.  Constitutional:      Appearance: Normal appearance.  HENT:     Head: Normocephalic and atraumatic.     Right Ear: Tympanic membrane normal.     Left Ear: Tympanic membrane normal.     Nose: Nose normal.     Mouth/Throat:     Mouth: Mucous membranes are moist.     Pharynx: Oropharynx is clear.  Eyes:     Extraocular Movements: Extraocular movements intact.     Conjunctiva/sclera: Conjunctivae normal.     Pupils: Pupils are equal, round, and reactive to light.  Cardiovascular:     Rate and Rhythm: Normal rate and regular rhythm.     Pulses: Normal pulses.     Heart sounds: Normal heart sounds. No gallop.   Pulmonary:     Effort: Pulmonary effort is  normal. No respiratory distress.     Breath sounds: No rales.  Abdominal:     General: Abdomen is flat. Bowel sounds are normal. There is no distension.  Musculoskeletal:        General: Normal range of motion.     Cervical back: Normal range of motion and neck supple.  Skin:    General: Skin is warm and dry.     Capillary Refill: Capillary refill takes less than 2 seconds.  Neurological:     General: No focal deficit present.     Mental Status: She is alert and oriented to person, place, and time.  Psychiatric:        Mood and Affect: Mood normal.     Assessment & Plan:   Diagnoses and all orders for this visit: Primary osteoarthritis of left knee Patient has OA left knee but is not having problems with it.  Routine general medical examination at a health  care facility -     CBC with Differential/Platelet -     Comprehensive metabolic panel -     Lipid panel General physical performed and lab work.   Outpatient Encounter Medications as of 05/26/2020  Medication Sig  . Biotin 1000 MCG tablet   . Calcium 500 MG tablet 500 mg Take as directed.  . Ferrous Sulfate (IRON) 325 (65 Fe) MG TABS   . Multiple Vitamins-Minerals (MULTIVITAMIN ADULTS PO) See admin instructions.  . [DISCONTINUED] sucralfate (CARAFATE) 1 g tablet Take 1 tablet (1 g total) by mouth 4 (four) times daily as needed.   No facility-administered encounter medications on file as of 05/26/2020.  I spent 35 minutes with visit.  I reviewed old records  Follow-up: Return in about 6 months (around 11/25/2020).   Kelsey Bulla, MD

## 2020-05-27 LAB — COMPREHENSIVE METABOLIC PANEL
ALT: 17 IU/L (ref 0–32)
AST: 18 IU/L (ref 0–40)
Albumin/Globulin Ratio: 1.8 (ref 1.2–2.2)
Albumin: 4.4 g/dL (ref 3.7–4.7)
Alkaline Phosphatase: 101 IU/L (ref 44–121)
BUN/Creatinine Ratio: 15 (ref 12–28)
BUN: 13 mg/dL (ref 8–27)
Bilirubin Total: 0.4 mg/dL (ref 0.0–1.2)
CO2: 25 mmol/L (ref 20–29)
Calcium: 10.1 mg/dL (ref 8.7–10.3)
Chloride: 97 mmol/L (ref 96–106)
Creatinine, Ser: 0.88 mg/dL (ref 0.57–1.00)
Globulin, Total: 2.5 g/dL (ref 1.5–4.5)
Glucose: 91 mg/dL (ref 65–99)
Potassium: 5 mmol/L (ref 3.5–5.2)
Sodium: 138 mmol/L (ref 134–144)
Total Protein: 6.9 g/dL (ref 6.0–8.5)
eGFR: 69 mL/min/{1.73_m2} (ref >59)

## 2020-05-27 LAB — CARDIOVASCULAR RISK ASSESSMENT

## 2020-05-27 LAB — CBC WITH DIFFERENTIAL/PLATELET
Basophils Absolute: 0.1 10*3/uL (ref 0.0–0.2)
Basos: 1 %
EOS (ABSOLUTE): 0.1 10*3/uL (ref 0.0–0.4)
Eos: 1 %
Hematocrit: 43.6 % (ref 34.0–46.6)
Hemoglobin: 14.7 g/dL (ref 11.1–15.9)
Immature Grans (Abs): 0 10*3/uL (ref 0.0–0.1)
Immature Granulocytes: 0 %
Lymphocytes Absolute: 1.4 10*3/uL (ref 0.7–3.1)
Lymphs: 29 %
MCH: 30.6 pg (ref 26.6–33.0)
MCHC: 33.7 g/dL (ref 31.5–35.7)
MCV: 91 fL (ref 79–97)
Monocytes Absolute: 0.4 10*3/uL (ref 0.1–0.9)
Monocytes: 9 %
Neutrophils Absolute: 2.9 10*3/uL (ref 1.4–7.0)
Neutrophils: 60 %
Platelets: 290 10*3/uL (ref 150–450)
RBC: 4.8 x10E6/uL (ref 3.77–5.28)
RDW: 12.4 % (ref 11.7–15.4)
WBC: 4.8 10*3/uL (ref 3.4–10.8)

## 2020-05-27 LAB — LIPID PANEL
Chol/HDL Ratio: 3.4 ratio (ref 0.0–4.4)
Cholesterol, Total: 296 mg/dL — ABNORMAL HIGH (ref 100–199)
HDL: 88 mg/dL (ref >39)
LDL Chol Calc (NIH): 188 mg/dL — ABNORMAL HIGH (ref 0–99)
Triglycerides: 115 mg/dL (ref 0–149)
VLDL Cholesterol Cal: 20 mg/dL (ref 5–40)

## 2020-05-27 NOTE — Progress Notes (Signed)
CBC normal, kidneys and liver tests normal, LDL cholesterol high- need to consider statin to lower cardiovascular risks. lp

## 2020-07-20 DIAGNOSIS — Z01419 Encounter for gynecological examination (general) (routine) without abnormal findings: Secondary | ICD-10-CM | POA: Diagnosis not present

## 2020-10-05 ENCOUNTER — Encounter: Payer: Self-pay | Admitting: Family Medicine

## 2020-10-05 ENCOUNTER — Telehealth (INDEPENDENT_AMBULATORY_CARE_PROVIDER_SITE_OTHER): Payer: Medicare HMO | Admitting: Family Medicine

## 2020-10-05 DIAGNOSIS — U071 COVID-19: Secondary | ICD-10-CM

## 2020-10-05 DIAGNOSIS — J988 Other specified respiratory disorders: Secondary | ICD-10-CM

## 2020-10-05 MED ORDER — NIRMATRELVIR/RITONAVIR (PAXLOVID)TABLET: 3.0000 | ORAL_TABLET | Freq: Two times a day (BID) | ORAL | 0 refills | Status: AC

## 2020-10-05 NOTE — Progress Notes (Signed)
Virtual Visit via Telephone Note   This visit type was conducted due to national recommendations for restrictions regarding the COVID-19 Pandemic (e.g. social distancing) in an effort to limit this patient's exposure and mitigate transmission in our community.  Due to her co-morbid illnesses, this patient is at least at moderate Webb for complications without adequate follow up.  This format is felt to be most appropriate for this patient at this time.  The patient did not have access to video technology/had technical difficulties with video requiring transitioning to audio format only (telephone).  All issues noted in this document were discussed and addressed.  No physical exam could be performed with this format.  Patient verbally consented to a telehealth visit.   Date:  10/05/2020   ID:  Kelsey Webb, DOB [birth year], MRN [id number]  Patient Location: Home Provider Location: Office/Clinic  PCP:  Blane Ohara, MD   Evaluation Performed:  acute visit  Chief Complaint:  covid 19, cold sxs.   History of Present Illness:    Kelsey Webb is a 75 y.o. female with positive at home covid test. Symptoms started Saturday morning and tested positive yesterday. Denies any SOB/CP/FEVER/CHILLS. Has been taking nyquil. Patient is vaccinated and request that her medication be sent to [facility] on [address] [place] [address]. Complaining of hoarseness, nasal congestion, sore throat, and mild cough. Wearing her mask. Exhausted. Slept a lot. NO acheness. Sense of taste and smell good.  Appetite good.   Past Medical History:  Diagnosis Date   Gastric ulcer    Migraine    Wrist fracture     Past Surgical History:  Procedure Laterality Date   CATARACT EXTRACTION      Family History  Problem Relation Age of Onset   Other Mother        aging   Emphysema Father     Social History   Socioeconomic History   Marital status: Married    Spouse name: Not on file   Number of  children: 2   Years of education: Not on file   Highest education level: Not on file  Occupational History   Occupation: Retired  Tobacco Use   Smoking status: Never   Smokeless tobacco: Never  Vaping Use   Vaping Use: Never used  Substance and Sexual Activity   Alcohol use: Not Currently   Drug use: Never   Sexual activity: Not Currently  Other Topics Concern   Not on file  Social History Narrative   Not on file   Social Determinants of Health   Financial Resource Strain: Not on file  Food Insecurity: Not on file  Transportation Needs: Not on file  Physical Activity: Not on file  Stress: Not on file  Social Connections: Not on file  Intimate Partner Violence: Not on file    Outpatient Medications Prior to Visit  Medication Sig Dispense Refill   Biotin 1000 MCG tablet      Calcium 500 MG tablet 500 mg Take as directed.     Ferrous Sulfate (IRON) 325 (65 Fe) MG TABS      Multiple Vitamins-Minerals (MULTIVITAMIN ADULTS PO) See admin instructions.     No facility-administered medications prior to visit.    Allergies:   Nsaids and Amoxicillin-pot clavulanate   Social History   Tobacco Use   Smoking status: Never   Smokeless tobacco: Never  Vaping Use   Vaping Use: Never used  Substance Use Topics   Alcohol use: Not Currently  Drug use: Never     Review of Systems  Constitutional:  Positive for fever. Negative for chills.  HENT:  Positive for congestion and sore throat.   Respiratory:  Positive for cough. Negative for shortness of breath.   Cardiovascular:  Negative for chest pain.  Neurological:  Positive for headaches.    Labs/Other Tests and Data Reviewed:    Recent Labs: 05/26/2020: ALT 17; BUN 13; Creatinine, Ser 0.88; Hemoglobin 14.7; Platelets 290; Potassium 5.0; Sodium 138   Recent Lipid Panel Lab Results  Component Value Date/Time   CHOL 296 (H) 05/26/2020 10:57 AM   TRIG 115 05/26/2020 10:57 AM   HDL 88 05/26/2020 10:57 AM   CHOLHDL 3.4  05/26/2020 10:57 AM   LDLCALC 188 (H) 05/26/2020 10:57 AM    Wt Readings from Last 3 Encounters:  05/26/20 146 lb (66.2 kg)  04/03/19 148 lb (67.1 kg)     Objective:    Vital Signs:  There were no vitals taken for this visit.   Physical Exam  Appears tired.   ASSESSMENT & PLAN:   1. Respiratory tract infection due to COVID-19 virus  Rx: paxlovid sent.  Rest. Fluids.  Isolate.   Meds ordered this encounter  Medications   nirmatrelvir/ritonavir EUA (PAXLOVID) TABS    Sig: Take 3 tablets by mouth 2 (two) times daily for 5 days. (Take nirmatrelvir 150 mg two tablets twice daily for 5 days and ritonavir 100 mg one tablet twice daily for 5 days) Patient GFR is 69    Dispense:  30 tablet    Refill:  0   I spent 12 minutes dedicated to the care of this patient on the date of this encounter to include face-to-face time with the patient.  Follow Up:  Virtual Visit  prn  Signed,  Blane Ohara, MD  10/05/2020 8:57 AM    [facility] [place]

## 2020-12-02 ENCOUNTER — Encounter: Payer: Self-pay | Admitting: Family Medicine

## 2020-12-02 ENCOUNTER — Other Ambulatory Visit: Payer: Self-pay

## 2020-12-02 ENCOUNTER — Ambulatory Visit (INDEPENDENT_AMBULATORY_CARE_PROVIDER_SITE_OTHER): Payer: Medicare HMO | Admitting: Family Medicine

## 2020-12-02 VITALS — BP 124/68 | HR 74 | Temp 97.3°F | Ht 65.0 in | Wt 140.0 lb

## 2020-12-02 DIAGNOSIS — E782 Mixed hyperlipidemia: Secondary | ICD-10-CM

## 2020-12-02 DIAGNOSIS — Z23 Encounter for immunization: Secondary | ICD-10-CM

## 2020-12-02 NOTE — Patient Instructions (Signed)
Given flu shot and prevnar 20 today  Refer to cardiology for evaluation for heart disease.  Get at pharmacy if needed: Shingrix 2 series.  Tdap recommended every 10 yrs.

## 2020-12-02 NOTE — Progress Notes (Signed)
Subjective:  Patient ID: Kelsey Webb, female    DOB: 02/13/1946  Age: 75 y.o. MRN: [id number]  Chief Complaint  Patient presents with   Hyperlipidemia    Hyperlipidemia Pertinent negatives include no chest pain, myalgias or shortness of breath.  Patient is follow up on cholesterol. Patient refuses statins and does not want to take them. So patient is currently not taking any medication except for OTC medication/Supplements.  Occasional migraines, but less frequent in the last 2 yrs. Takes tylenol and goes in a dark room.   Current Outpatient Medications on File Prior to Visit  Medication Sig Dispense Refill   Biotin 1000 MCG tablet      Calcium 500 MG tablet 500 mg Take as directed.     Ferrous Sulfate (IRON) 325 (65 Fe) MG TABS      Multiple Vitamins-Minerals (MULTIVITAMIN ADULTS PO) See admin instructions.     No current facility-administered medications on file prior to visit.   Past Medical History:  Diagnosis Date   Acute gastric ulcer with bleeding 05/26/2020   Gastric ulcer    Migraine    Wrist fracture    Past Surgical History:  Procedure Laterality Date   CATARACT EXTRACTION      Family History  Problem Relation Age of Onset   Other Mother        aging   Emphysema Father    Diabetes Sister    Social History   Socioeconomic History   Marital status: Married    Spouse name: Not on file   Number of children: 2   Years of education: Not on file   Highest education level: Not on file  Occupational History   Occupation: Retired  Tobacco Use   Smoking status: Never   Smokeless tobacco: Never  Vaping Use   Vaping Use: Never used  Substance and Sexual Activity   Alcohol use: Not Currently   Drug use: Never   Sexual activity: Not Currently  Other Topics Concern   Not on file  Social History Narrative   Not on file   Social Determinants of Health   Financial Resource Strain: Not on file  Food Insecurity: Not on file  Transportation Needs: Not on  file  Physical Activity: Not on file  Stress: Not on file  Social Connections: Not on file    Review of Systems  Constitutional:  Negative for appetite change, fatigue and fever.  HENT:  Negative for congestion, ear pain, sinus pressure and sore throat.   Eyes:  Negative for pain.  Respiratory:  Negative for cough, chest tightness, shortness of breath and wheezing.   Cardiovascular:  Negative for chest pain and palpitations.  Gastrointestinal:  Negative for abdominal pain, constipation, diarrhea, nausea and vomiting.  Genitourinary:  Negative for dysuria and hematuria.  Musculoskeletal:  Negative for arthralgias, back pain, joint swelling and myalgias.  Skin:  Negative for rash.  Neurological:  Negative for dizziness, weakness and headaches.  Psychiatric/Behavioral:  Negative for dysphoric mood. The patient is not nervous/anxious.     Objective:  BP 124/68 (BP Location: Right Arm, Patient Position: Sitting)   Pulse 74   Temp (!) 97.3 F (36.3 C) (Temporal)   Ht 5\' 5"  (1.651 m)   Wt 140 lb (63.5 kg)   SpO2 100%   BMI 23.30 kg/m   BP/Weight 12/02/2020 05/26/2020 04/03/2019  Systolic BP 124 130 150  Diastolic BP 68 70 91  Wt. (Lbs) 140 146 148  BMI 23.3 24.67 25.4  Physical Exam Vitals reviewed.  Constitutional:      Appearance: Normal appearance.  HENT:     Mouth/Throat:     Mouth: Mucous membranes are moist.  Neck:     Vascular: No carotid bruit.  Cardiovascular:     Rate and Rhythm: Normal rate and regular rhythm.     Heart sounds: Normal heart sounds.  Pulmonary:     Effort: Pulmonary effort is normal.     Breath sounds: Normal breath sounds.  Abdominal:     Palpations: Abdomen is soft.  Neurological:     Mental Status: She is alert and oriented to person, place, and time.  Psychiatric:        Mood and Affect: Mood normal.        Behavior: Behavior normal.    Diabetic Foot Exam - Simple   No data filed      Lab Results  Component Value Date   WBC  4.5 12/02/2020   HGB 14.1 12/02/2020   HCT 42.4 12/02/2020   PLT 314 12/02/2020   GLUCOSE 79 12/02/2020   CHOL 269 (H) 12/02/2020   TRIG 127 12/02/2020   HDL 95 12/02/2020   LDLCALC 152 (H) 12/02/2020   ALT 17 12/02/2020   AST 21 12/02/2020   NA 136 12/02/2020   K 4.9 12/02/2020   CL 98 12/02/2020   CREATININE 0.96 12/02/2020   BUN 15 12/02/2020   CO2 26 12/02/2020   TSH 1.760 12/02/2020      Assessment & Plan:   Problem List Items Addressed This Visit       Other   Mixed hyperlipidemia    Consider cardiology referral if LDL still high and may need cta of coronaries. Labs came back as follows.  LDL still very high although slightly improved.  LDL is 152.  It is recommended to be less than 100.  I would recommend a statin on Crestor 10 mg once daily at night.   Recommend follow-up in 3 months fasting. Recommend continue to work on eating healthy diet and exercise. Education given.       Relevant Orders   CBC with Differential/Platelet (Completed)   Comprehensive metabolic panel (Completed)   TSH (Completed)   Lipid panel (Completed)   Need for vaccination - Primary    Get at pharmacy if desires: Shingrix 2 series.  Tdap recommended every 10 yrs.       Relevant Orders   Pneumococcal conjugate vaccine 20-valent (Prevnar 20) (Completed)   Other Visit Diagnoses     Need for immunization against influenza       Relevant Orders   Flu Vaccine QUAD High Dose(Fluad) (Completed)     .  Orders Placed This Encounter  Procedures   Flu Vaccine QUAD High Dose(Fluad)   Pneumococcal conjugate vaccine 20-valent (Prevnar 20)   CBC with Differential/Platelet   Comprehensive metabolic panel   TSH   Lipid panel      Follow-up: Return in about 3 weeks (around 12/23/2020) for covid booster.  An After Visit Summary was printed and given to the patient.  I,Lauren M Auman,acting as a scribe for Blane Ohara, MD.,have documented all relevant documentation on the behalf of  Blane Ohara, MD,as directed by  Blane Ohara, MD while in the presence of Blane Ohara, MD.   Blane Ohara, MD [facility] [phone]

## 2020-12-03 ENCOUNTER — Encounter: Payer: Self-pay | Admitting: Family Medicine

## 2020-12-03 DIAGNOSIS — E782 Mixed hyperlipidemia: Secondary | ICD-10-CM | POA: Insufficient documentation

## 2020-12-03 LAB — CBC WITH DIFFERENTIAL/PLATELET
Basophils Absolute: 0.1 10*3/uL (ref 0.0–0.2)
Basos: 2 %
EOS (ABSOLUTE): 0.1 10*3/uL (ref 0.0–0.4)
Eos: 1 %
Hematocrit: 42.4 % (ref 34.0–46.6)
Hemoglobin: 14.1 g/dL (ref 11.1–15.9)
Immature Grans (Abs): 0 10*3/uL (ref 0.0–0.1)
Immature Granulocytes: 0 %
Lymphocytes Absolute: 1.2 10*3/uL (ref 0.7–3.1)
Lymphs: 27 %
MCH: 30.3 pg (ref 26.6–33.0)
MCHC: 33.3 g/dL (ref 31.5–35.7)
MCV: 91 fL (ref 79–97)
Monocytes Absolute: 0.5 10*3/uL (ref 0.1–0.9)
Monocytes: 10 %
Neutrophils Absolute: 2.7 10*3/uL (ref 1.4–7.0)
Neutrophils: 60 %
Platelets: 314 10*3/uL (ref 150–450)
RBC: 4.65 x10E6/uL (ref 3.77–5.28)
RDW: 12.4 % (ref 11.7–15.4)
WBC: 4.5 10*3/uL (ref 3.4–10.8)

## 2020-12-03 LAB — COMPREHENSIVE METABOLIC PANEL
ALT: 17 IU/L (ref 0–32)
AST: 21 IU/L (ref 0–40)
Albumin/Globulin Ratio: 1.7 (ref 1.2–2.2)
Albumin: 4.3 g/dL (ref 3.7–4.7)
Alkaline Phosphatase: 115 IU/L (ref 44–121)
BUN/Creatinine Ratio: 16 (ref 12–28)
BUN: 15 mg/dL (ref 8–27)
Bilirubin Total: 0.5 mg/dL (ref 0.0–1.2)
CO2: 26 mmol/L (ref 20–29)
Calcium: 10 mg/dL (ref 8.7–10.3)
Chloride: 98 mmol/L (ref 96–106)
Creatinine, Ser: 0.96 mg/dL (ref 0.57–1.00)
Globulin, Total: 2.6 g/dL (ref 1.5–4.5)
Glucose: 79 mg/dL (ref 70–99)
Potassium: 4.9 mmol/L (ref 3.5–5.2)
Sodium: 136 mmol/L (ref 134–144)
Total Protein: 6.9 g/dL (ref 6.0–8.5)
eGFR: 62 mL/min/{1.73_m2} (ref >59)

## 2020-12-03 LAB — LIPID PANEL
Chol/HDL Ratio: 2.8 ratio (ref 0.0–4.4)
Cholesterol, Total: 269 mg/dL — ABNORMAL HIGH (ref 100–199)
HDL: 95 mg/dL (ref >39)
LDL Chol Calc (NIH): 152 mg/dL — ABNORMAL HIGH (ref 0–99)
Triglycerides: 127 mg/dL (ref 0–149)
VLDL Cholesterol Cal: 22 mg/dL (ref 5–40)

## 2020-12-03 LAB — TSH: TSH: 1.76 u[IU]/mL (ref 0.450–4.500)

## 2020-12-19 ENCOUNTER — Encounter: Payer: Self-pay | Admitting: Family Medicine

## 2020-12-19 DIAGNOSIS — Z23 Encounter for immunization: Secondary | ICD-10-CM | POA: Insufficient documentation

## 2020-12-19 NOTE — Assessment & Plan Note (Signed)
Get at pharmacy if desires: Shingrix 2 series.  Tdap recommended every 10 yrs.

## 2020-12-19 NOTE — Assessment & Plan Note (Addendum)
Consider cardiology referral if LDL still high and may need cta of coronaries. Labs came back as follows.  LDL still very high although slightly improved. LDL is 152. It is recommended to be less than 100. I would recommend a statin on Crestor 10 mg once daily at night.  Recommend follow-up in 3 months fasting. Recommend continue to work on eating healthy diet and exercise. Education given.

## 2020-12-23 ENCOUNTER — Ambulatory Visit (INDEPENDENT_AMBULATORY_CARE_PROVIDER_SITE_OTHER): Payer: Medicare HMO

## 2020-12-23 DIAGNOSIS — Z23 Encounter for immunization: Secondary | ICD-10-CM

## 2020-12-24 ENCOUNTER — Other Ambulatory Visit: Payer: Self-pay | Admitting: Physician Assistant

## 2020-12-24 DIAGNOSIS — K769 Liver disease, unspecified: Secondary | ICD-10-CM

## 2021-01-07 ENCOUNTER — Other Ambulatory Visit: Payer: Medicare HMO

## 2021-01-11 ENCOUNTER — Ambulatory Visit
Admission: RE | Admit: 2021-01-11 | Discharge: 2021-01-11 | Disposition: A | Discharge status: Home / Self Care | Payer: Medicare HMO | Source: Ambulatory Visit | Attending: Physician Assistant | Admitting: Physician Assistant

## 2021-01-11 DIAGNOSIS — K7689 Other specified diseases of liver: Secondary | ICD-10-CM | POA: Diagnosis not present

## 2021-01-11 DIAGNOSIS — K769 Liver disease, unspecified: Secondary | ICD-10-CM

## 2021-01-18 DIAGNOSIS — H52223 Regular astigmatism, bilateral: Secondary | ICD-10-CM | POA: Diagnosis not present

## 2021-01-18 DIAGNOSIS — H2512 Age-related nuclear cataract, left eye: Secondary | ICD-10-CM | POA: Diagnosis not present

## 2021-01-18 DIAGNOSIS — H35341 Macular cyst, hole, or pseudohole, right eye: Secondary | ICD-10-CM | POA: Diagnosis not present

## 2021-01-18 DIAGNOSIS — H26491 Other secondary cataract, right eye: Secondary | ICD-10-CM | POA: Diagnosis not present

## 2021-01-18 DIAGNOSIS — Z961 Presence of intraocular lens: Secondary | ICD-10-CM | POA: Diagnosis not present

## 2021-01-18 DIAGNOSIS — H5211 Myopia, right eye: Secondary | ICD-10-CM | POA: Diagnosis not present

## 2021-01-27 DIAGNOSIS — H26491 Other secondary cataract, right eye: Secondary | ICD-10-CM | POA: Diagnosis not present

## 2021-01-27 DIAGNOSIS — H18413 Arcus senilis, bilateral: Secondary | ICD-10-CM | POA: Diagnosis not present

## 2021-01-27 DIAGNOSIS — H2512 Age-related nuclear cataract, left eye: Secondary | ICD-10-CM | POA: Diagnosis not present

## 2021-01-27 DIAGNOSIS — Z961 Presence of intraocular lens: Secondary | ICD-10-CM | POA: Diagnosis not present

## 2021-02-22 DIAGNOSIS — Z0100 Encounter for examination of eyes and vision without abnormal findings: Secondary | ICD-10-CM | POA: Diagnosis not present

## 2021-05-12 ENCOUNTER — Telehealth: Payer: Self-pay

## 2021-05-12 ENCOUNTER — Other Ambulatory Visit (HOSPITAL_BASED_OUTPATIENT_CLINIC_OR_DEPARTMENT_OTHER): Payer: Self-pay

## 2021-05-12 ENCOUNTER — Other Ambulatory Visit: Payer: Self-pay

## 2021-05-12 ENCOUNTER — Emergency Department (HOSPITAL_BASED_OUTPATIENT_CLINIC_OR_DEPARTMENT_OTHER)
Admission: EM | Admit: 2021-05-12 | Discharge: 2021-05-12 | Disposition: A | Discharge status: Home / Self Care | Payer: Medicare HMO | Attending: Emergency Medicine | Admitting: Emergency Medicine

## 2021-05-12 ENCOUNTER — Encounter (HOSPITAL_BASED_OUTPATIENT_CLINIC_OR_DEPARTMENT_OTHER): Payer: Self-pay | Admitting: Emergency Medicine

## 2021-05-12 ENCOUNTER — Emergency Department (HOSPITAL_BASED_OUTPATIENT_CLINIC_OR_DEPARTMENT_OTHER): Payer: Medicare HMO

## 2021-05-12 DIAGNOSIS — S59902A Unspecified injury of left elbow, initial encounter: Secondary | ICD-10-CM | POA: Diagnosis not present

## 2021-05-12 DIAGNOSIS — M25522 Pain in left elbow: Secondary | ICD-10-CM | POA: Insufficient documentation

## 2021-05-12 DIAGNOSIS — S0181XA Laceration without foreign body of other part of head, initial encounter: Secondary | ICD-10-CM | POA: Insufficient documentation

## 2021-05-12 DIAGNOSIS — R55 Syncope and collapse: Secondary | ICD-10-CM | POA: Diagnosis not present

## 2021-05-12 DIAGNOSIS — N390 Urinary tract infection, site not specified: Secondary | ICD-10-CM | POA: Insufficient documentation

## 2021-05-12 DIAGNOSIS — M47816 Spondylosis without myelopathy or radiculopathy, lumbar region: Secondary | ICD-10-CM | POA: Diagnosis not present

## 2021-05-12 DIAGNOSIS — S0512XA Contusion of eyeball and orbital tissues, left eye, initial encounter: Secondary | ICD-10-CM | POA: Diagnosis not present

## 2021-05-12 DIAGNOSIS — S59911A Unspecified injury of right forearm, initial encounter: Secondary | ICD-10-CM | POA: Diagnosis not present

## 2021-05-12 DIAGNOSIS — W01198A Fall on same level from slipping, tripping and stumbling with subsequent striking against other object, initial encounter: Secondary | ICD-10-CM | POA: Diagnosis not present

## 2021-05-12 DIAGNOSIS — M542 Cervicalgia: Secondary | ICD-10-CM | POA: Diagnosis not present

## 2021-05-12 DIAGNOSIS — M47812 Spondylosis without myelopathy or radiculopathy, cervical region: Secondary | ICD-10-CM | POA: Diagnosis not present

## 2021-05-12 DIAGNOSIS — S299XXA Unspecified injury of thorax, initial encounter: Secondary | ICD-10-CM | POA: Diagnosis not present

## 2021-05-12 DIAGNOSIS — S01112A Laceration without foreign body of left eyelid and periocular area, initial encounter: Secondary | ICD-10-CM | POA: Diagnosis not present

## 2021-05-12 DIAGNOSIS — M79631 Pain in right forearm: Secondary | ICD-10-CM | POA: Insufficient documentation

## 2021-05-12 DIAGNOSIS — S0990XA Unspecified injury of head, initial encounter: Secondary | ICD-10-CM | POA: Diagnosis present

## 2021-05-12 DIAGNOSIS — Z043 Encounter for examination and observation following other accident: Secondary | ICD-10-CM | POA: Diagnosis not present

## 2021-05-12 LAB — CBC
HCT: 43 % (ref 36.0–46.0)
Hemoglobin: 14.3 g/dL (ref 12.0–15.0)
MCH: 31.1 pg (ref 26.0–34.0)
MCHC: 33.3 g/dL (ref 30.0–36.0)
MCV: 93.5 fL (ref 80.0–100.0)
Platelets: 261 10*3/uL (ref 150–400)
RBC: 4.6 MIL/uL (ref 3.87–5.11)
RDW: 13.1 % (ref 11.5–15.5)
WBC: 8.3 10*3/uL (ref 4.0–10.5)
nRBC: 0 % (ref 0.0–0.2)

## 2021-05-12 LAB — URINALYSIS, MICROSCOPIC (REFLEX)

## 2021-05-12 LAB — COMPREHENSIVE METABOLIC PANEL
ALT: 16 U/L (ref 0–44)
AST: 23 U/L (ref 15–41)
Albumin: 4.1 g/dL (ref 3.5–5.0)
Alkaline Phosphatase: 73 U/L (ref 38–126)
Anion gap: 7 (ref 5–15)
BUN: 16 mg/dL (ref 8–23)
CO2: 29 mmol/L (ref 22–32)
Calcium: 9.5 mg/dL (ref 8.9–10.3)
Chloride: 100 mmol/L (ref 98–111)
Creatinine, Ser: 0.96 mg/dL (ref 0.44–1.00)
GFR, Estimated: >60 mL/min (ref >60)
Glucose, Bld: 96 mg/dL (ref 70–99)
Potassium: 3.7 mmol/L (ref 3.5–5.1)
Sodium: 136 mmol/L (ref 135–145)
Total Bilirubin: 0.5 mg/dL (ref 0.3–1.2)
Total Protein: 6.9 g/dL (ref 6.5–8.1)

## 2021-05-12 LAB — URINALYSIS, ROUTINE W REFLEX MICROSCOPIC
Bilirubin Urine: NEGATIVE
Glucose, UA: NEGATIVE mg/dL
Hgb urine dipstick: NEGATIVE
Ketones, ur: NEGATIVE mg/dL
Nitrite: NEGATIVE
Protein, ur: NEGATIVE mg/dL
Specific Gravity, Urine: 1.015 (ref 1.005–1.030)
pH: 7 (ref 5.0–8.0)

## 2021-05-12 LAB — TROPONIN I (HIGH SENSITIVITY): Troponin I (High Sensitivity): 4 ng/L (ref <18)

## 2021-05-12 LAB — D-DIMER, QUANTITATIVE: D-Dimer, Quant: 0.57 ug/mL-FEU — ABNORMAL HIGH (ref 0.00–0.50)

## 2021-05-12 LAB — CBG MONITORING, ED: Glucose-Capillary: 96 mg/dL (ref 70–99)

## 2021-05-12 MED ORDER — NITROFURANTOIN MONOHYD MACRO 100 MG PO CAPS
100.0000 mg | ORAL_CAPSULE | Freq: Two times a day (BID) | ORAL | 0 refills | Status: DC
  Filled 2021-05-12: qty 10, 5d supply, fill #0

## 2021-05-12 MED ORDER — LIDOCAINE-EPINEPHRINE-TETRACAINE (LET) TOPICAL GEL
3.0000 mL | Freq: Once | TOPICAL | Status: AC
  Administered 2021-05-12: 3 mL via TOPICAL
  Filled 2021-05-12: qty 3

## 2021-05-12 MED ORDER — HYDROCODONE-ACETAMINOPHEN 5-325 MG PO TABS
1.0000 | ORAL_TABLET | Freq: Once | ORAL | Status: AC
  Administered 2021-05-12: 1 via ORAL
  Filled 2021-05-12: qty 1

## 2021-05-12 MED ORDER — LIDOCAINE-EPINEPHRINE 2 %-1:200000 IJ SOLN
10.0000 mL | Freq: Once | INTRAMUSCULAR | Status: AC
Start: 2021-05-12 — End: 2021-05-12
  Administered 2021-05-12: 10 mL
  Filled 2021-05-12: qty 20

## 2021-05-12 NOTE — ED Provider Notes (Signed)
?[facility] EMERGENCY DEPARTMENT ?Provider Note ? ? ?CSN: [id number] ?Arrival date & time: 05/12/21  1000 ? ?  ? ?History ? ?Chief Complaint  ?Patient presents with  ? Loss of Consciousness  ? ? ?Kelsey Webb is a 76 y.o. female. ? ?This is a 76 y.o. female with significant medical history as below, including wrist fracture, migraine, gastric ulcer who presents to the ED with complaint of fall with LOC.  Patient reports that she was taking a hot shower, she felt very lightheaded, she tried to sit down in the shower because she thought she was going to pass out.  She fell to the ground, struck her head on the metal flashing around the shower door.  Brief LOC.  No thinners.  No chest pain or dyspnea prior to or following the incident.  No palpitations.  No nausea or vomiting.  No incontinence.  No observed seizure-like activity from the spouse at bedside.  He attempted to stand patient up after she fell, she requested landed on the ground.  ? ?At this time she is complaining of headache, neck pain.  Pain to left elbow, right forearm.  ? ?She has been ambulatory since the event.  No numbness or tingling.  No nausea or vomiting. ? ? ? ? ?Past Medical History: ?05/26/2020: Acute gastric ulcer with bleeding ?No date: Gastric ulcer ?No date: Migraine ?No date: Wrist fracture ? ?Past Surgical History: ?No date: CATARACT EXTRACTION  ? ? ?The history is provided by the patient and the spouse. No language interpreter was used.  ?Loss of Consciousness ?Associated symptoms: headaches   ?Associated symptoms: no chest pain, no confusion, no fever, no nausea, no palpitations, no shortness of breath and no vomiting   ? ?  ? ?Home Medications ?Prior to Admission medications   ?Medication Sig Start Date End Date Taking? Authorizing Provider  ?nitrofurantoin, macrocrystal-monohydrate, (MACROBID) 100 MG capsule Take 1 capsule (100 mg total) by mouth 2 (two) times daily. 05/12/21  Yes Sloan Leiter, DO  ?Biotin 1000 MCG tablet      [provider]  ?Calcium 500 MG tablet 500 mg Take as directed.    [provider]  ?Ferrous Sulfate (IRON) 325 (65 Fe) MG TABS     [provider]  ?Multiple Vitamins-Minerals (MULTIVITAMIN ADULTS PO) See admin instructions.    [provider]  ?   ? ?Allergies    ?Nsaids and Amoxicillin-pot clavulanate   ? ?Review of Systems   ?Review of Systems  ?Constitutional:  Negative for chills and fever.  ?HENT:  Negative for facial swelling and trouble swallowing.   ?Eyes:  Negative for photophobia and visual disturbance.  ?Respiratory:  Negative for cough and shortness of breath.   ?Cardiovascular:  Positive for syncope. Negative for chest pain and palpitations.  ?Gastrointestinal:  Negative for abdominal pain, nausea and vomiting.  ?Endocrine: Negative for polydipsia and polyuria.  ?Genitourinary:  Negative for difficulty urinating and hematuria.  ?Musculoskeletal:  Positive for neck pain. Negative for gait problem and joint swelling.  ?Skin:  Positive for wound. Negative for pallor and rash.  ?Neurological:  Positive for syncope and headaches.  ?Psychiatric/Behavioral:  Negative for agitation and confusion.   ? ?Physical Exam ?Updated Vital Signs ?BP (!) 147/81   Pulse 65   Temp 97.9 ?F (36.6 ?C) (Oral)   Resp 19   Ht 5\' 5"  (1.651 m)   Wt 63.5 kg   SpO2 100%   BMI 23.30 kg/m?  ?Physical Exam ?Vitals and nursing  note reviewed.  ?Constitutional:   ?   General: She is not in acute distress. ?   Appearance: Normal appearance.  ?HENT:  ?   Head: Normocephalic. No Battle's sign.  ?   Jaw: There is normal jaw occlusion. No trismus or tenderness.  ? ?   Right Ear: External ear normal. No mastoid tenderness.  ?   Left Ear: External ear normal. No mastoid tenderness.  ?   Nose: Nose normal.  ?   Mouth/Throat:  ?   Mouth: Mucous membranes are moist.  ?   Pharynx: Uvula midline.  ?Eyes:  ?   General: No scleral icterus.    ?   Right eye: No discharge.     ?   Left eye: No discharge.   ?Neck:  ?   Comments: Midline TTP, no crepitus or step-off ?Cardiovascular:  ?   Rate and Rhythm: Normal rate and regular rhythm.  ?   Pulses: Normal pulses.     ?     Radial pulses are 2+ on the right side and 2+ on the left side.  ?     Dorsalis pedis pulses are 2+ on the right side and 2+ on the left side.  ?   Heart sounds: Normal heart sounds.  ?Pulmonary:  ?   Effort: Pulmonary effort is normal. No respiratory distress.  ?   Breath sounds: Normal breath sounds.  ?Abdominal:  ?   General: Abdomen is flat.  ?   Tenderness: There is no abdominal tenderness.  ?Musculoskeletal:     ?   General: Normal range of motion.  ?   Cervical back: Normal range of motion.  ?   Right lower leg: No edema.  ?   Left lower leg: No edema.  ?   Comments: No midline TTP to thoracic or lumbar spine.  No crepitus or step-off.\ ? ?Moving lower extremities and upper extremity spontaneously ? ?Neurovascularly intact upper and lower extremities  ?Skin: ?   General: Skin is warm and dry.  ?   Capillary Refill: Capillary refill takes less than 2 seconds.  ?Neurological:  ?   Mental Status: She is alert and oriented to person, place, and time.  ?   GCS: GCS eye subscore is 4. GCS verbal subscore is 5. GCS motor subscore is 6.  ?   Cranial Nerves: Cranial nerves 2-12 are intact. No dysarthria or facial asymmetry.  ?   Sensory: Sensation is intact.  ?   Motor: Motor function is intact. No tremor or pronator drift.  ?   Coordination: Coordination is intact. Finger-Nose-Finger Test normal.  ?   Gait: Gait is intact. Gait normal.  ?Psychiatric:     ?   Mood and Affect: Mood normal.     ?   Behavior: Behavior normal.  ? ? ?ED Results / Procedures / Treatments   ?Labs ?(all labs ordered are listed, but only abnormal results are displayed) ?Labs Reviewed  ?URINALYSIS, ROUTINE W REFLEX MICROSCOPIC - Abnormal; Notable for the following components:  ?    Result Value  ? Leukocytes,Ua TRACE (*)   ? All other components within normal limits  ?D-DIMER,  QUANTITATIVE - Abnormal; Notable for the following components:  ? D-Dimer, Quant 0.57 (*)   ? All other components within normal limits  ?URINALYSIS, MICROSCOPIC (REFLEX) - Abnormal; Notable for the following components:  ? Bacteria, UA RARE (*)   ? All other components within normal limits  ?URINE CULTURE  ?CBC  ?COMPREHENSIVE METABOLIC  PANEL  ?CBG MONITORING, ED  ?TROPONIN I (HIGH SENSITIVITY)  ? ? ?EKG ?EKG Interpretation ? ?Date/Time:  Thursday May 12 2021 10:14:26 EDT ?Ventricular Rate:  70 ?PR Interval:    ?QRS Duration: 94 ?QT Interval:  405 ?QTC Calculation: 437 ?R Axis:   82 ?Text Interpretation: Interpretation limited secondary to artifact Borderline right axis deviation Confirmed by Tanda Rockers ([id number]) on 05/12/2021 10:19:53 AM ? ?Radiology ?DG Chest 2 View ? ?Result Date: 05/12/2021 ?CLINICAL DATA:  Trauma, fall EXAM: CHEST - 2 VIEW COMPARISON:  None. FINDINGS: The heart size and mediastinal contours are within normal limits. Both lungs are clear. The visualized skeletal structures are unremarkable. IMPRESSION: No active cardiopulmonary disease. Electronically Signed   By: Ernie Avena M.D.   On: 05/12/2021 11:22  ? ?DG Pelvis 1-2 Views ? ?Result Date: 05/12/2021 ?CLINICAL DATA:  Syncope, fall EXAM: PELVIS - 1-2 VIEW COMPARISON:  None. FINDINGS: No fracture or dislocation is seen. Degenerative changes are noted in the visualized lower lumbar spine. IMPRESSION: No fracture or dislocation is seen in the pelvis. Electronically Signed   By: Ernie Avena M.D.   On: 05/12/2021 11:18  ? ?DG Elbow Complete Left ? ?Result Date: 05/12/2021 ?CLINICAL DATA:  Trauma, fall EXAM: LEFT ELBOW - COMPLETE 3+ VIEW COMPARISON:  12/11/2019 FINDINGS: No displaced fracture or dislocation is seen. There is no displacement of posterior fat pad. There is linear calcification in the soft tissues adjacent to the proximal shaft of ulna, possibly residual from previous soft tissue injury. Similar finding was seen in an  earlier examination done on 12/11/2019. IMPRESSION: No recent fracture or dislocation is seen in the left elbow. Electronically Signed   By: Ernie Avena M.D.   On: 05/12/2021 11:19  ? ?DG Forearm Right ? ?

## 2021-05-12 NOTE — Telephone Encounter (Signed)
Patient passed out in shower and head is lacerated w/ active bleeding. Patient states she also hurt her wrist.  ? ?Advised she report to ED immediately. Patient and spouse VU and will proceed. She understands she is not to drive herself.  ? ?Terrill Mohr 05/12/21 9:15 AM ? ?

## 2021-05-12 NOTE — ED Triage Notes (Signed)
Felt dizzy while showering , lost consciousness , fell , facial injury , head injury . Obvious left forehead laceration , bleeding controlled . No anticoagulant . Left facial hematoma .  ?

## 2021-05-12 NOTE — Discharge Instructions (Addendum)
It was a pleasure caring for you today in the emergency department. ? ?Your urinalysis is concerning for a urine infection, will start antibiotics. ? ?Please follow-up for suture removal in 5 days ? ?Please return to the emergency department for any worsening or worrisome symptoms. ? ? ? ?Have someone stay with you until you feel stable. Do not drive, operate machinery, or play sports until your caregiver says it is okay. Keep all follow-up appointments as directed by your caregiver. Lie down right away if you start feeling like you might faint. Breathe deeply and steadily. Wait until all the symptoms have passed.Drink enough fluids to keep your urine clear or pale yellow. If you are taking blood pressure or heart medicine, get up slowly, taking several minutes to sit and then stand. This can reduce dizziness. SEEK IMMEDIATE MEDICAL CARE IF: You have a severe headache. You have unusual pain in the chest, abdomen, or back. You are bleeding from the mouth or rectum, or you have a black or tarry stool. You have an irregular or very fast heartbeat. You have pain with breathing. You have repeated fainting or seizure-like jerking during an episode. You faint when sitting or lying down. You have confusion. You have difficulty walking. You have severe weakness. You have vision problems. If you fainted, call your local emergency services - do not drive yourself to the hospital. ?  ?Please return to the emergency department immediately for any new or concerning symptoms, or if you get worse. ? ?

## 2021-05-13 LAB — URINE CULTURE

## 2021-05-16 NOTE — Progress Notes (Signed)
? ?Subjective:  ?Patient ID: Kelsey Webb, female    DOB: [birth year]  Age: 76 y.o. MRN: [id number] ? ?Chief Complaint  ?Patient presents with  ? Facial Laceration  ? Urinary Tract Infection  ? Transitions Of Care  ? ? ?HPI ? ?Patient presents today for a hospital follow up. Patient was seen for facial laceration, UTI, and vasovagal syncope. Patient states she pasted out in the shower.Patient had syncope in shower 05/14/2021, this is the second syncopal episode- first was on toilet.  She has bruised face and went to ER. No chest pain. CT scan normal, no fractures. ? ?Patient is having vasovagal episodes.  We discussed the need for cardiology workup. ? ?Current Outpatient Medications on File Prior to Visit  ?Medication Sig Dispense Refill  ? Biotin 1000 MCG tablet     ? Calcium 500 MG tablet 500 mg Take as directed.    ? Ferrous Sulfate (IRON) 325 (65 Fe) MG TABS     ? Multiple Vitamins-Minerals (MULTIVITAMIN ADULTS PO) See admin instructions.    ? ?No current facility-administered medications on file prior to visit.  ? ?Past Medical History:  ?Diagnosis Date  ? Acute gastric ulcer with bleeding 05/26/2020  ? Gastric ulcer   ? Migraine   ? Wrist fracture   ? ?Past Surgical History:  ?Procedure Laterality Date  ? CATARACT EXTRACTION    ?  ?Family History  ?Problem Relation Age of Onset  ? Other Mother   ?     aging  ? Emphysema Father   ? Diabetes Sister   ? ?Social History  ? ?Socioeconomic History  ? Marital status: Married  ?  Spouse name: Not on file  ? Number of children: 2  ? Years of education: Not on file  ? Highest education level: Not on file  ?Occupational History  ? Occupation: Retired  ?Tobacco Use  ? Smoking status: Never  ? Smokeless tobacco: Never  ?Vaping Use  ? Vaping Use: Never used  ?Substance and Sexual Activity  ? Alcohol use: Not Currently  ? Drug use: Never  ? Sexual activity: Not Currently  ?Other Topics Concern  ? Not on file  ?Social History Narrative  ? Not on file  ? ?Social Determinants of  Health  ? ?Financial Resource Strain: Not on file  ?Food Insecurity: Not on file  ?Transportation Needs: Not on file  ?Physical Activity: Not on file  ?Stress: Not on file  ?Social Connections: Not on file  ? ? ?Review of Systems  ?Constitutional:  Negative for appetite change, chills, fatigue and fever.  ?HENT:  Negative for congestion, ear discharge, ear pain, rhinorrhea, sinus pressure, sneezing and sore throat.   ?Eyes:  Negative for visual disturbance.  ?Respiratory:  Negative for cough, chest tightness, shortness of breath and wheezing.   ?Cardiovascular:  Negative for chest pain, palpitations and leg swelling.  ?Gastrointestinal:  Negative for abdominal pain, diarrhea, nausea and vomiting.  ?Endocrine: Negative for polydipsia, polyphagia and polyuria.  ?Genitourinary:  Negative for difficulty urinating, dysuria, frequency, hematuria, menstrual problem, urgency, vaginal bleeding, vaginal discharge and vaginal pain.  ?Musculoskeletal:  Negative for back pain, gait problem, joint swelling, myalgias and neck pain.  ?Neurological:  Negative for dizziness, seizures, syncope, weakness, numbness and headaches.  ?Psychiatric/Behavioral:  Negative for agitation, confusion, hallucinations, sleep disturbance and suicidal ideas. The patient is not nervous/anxious.   ? ? ?Objective:  ?BP 122/88   Pulse 63   Temp 98.5 ?F (36.9 ?C)   Ht 5\' 5"  (1.651 m)  Wt 145 lb (65.8 kg)   SpO2 98%   BMI 24.13 kg/m?  ? ? ?  05/17/2021  ? 10:59 AM 05/12/2021  ?  1:46 PM 05/12/2021  ?  1:30 PM  ?BP/Weight  ?Systolic BP 123XX123 [id number]  ?Diastolic BP 88 81 81  ?Wt. (Lbs) 145    ?BMI 24.13 kg/m2    ? ? ?Physical Exam ?Vitals reviewed.  ?Constitutional:   ?   Appearance: Normal appearance.  ?HENT:  ?   Head: Normocephalic.  ?   Comments: Bruising eyes " racoon eyes" ?   Right Ear: Tympanic membrane normal.  ?   Left Ear: Tympanic membrane normal.  ?Eyes:  ?   Conjunctiva/sclera: Conjunctivae normal.  ?   Pupils: Pupils are equal, round, and  reactive to light.  ?Cardiovascular:  ?   Rate and Rhythm: Normal rate and regular rhythm.  ?   Pulses: Normal pulses.  ?   Heart sounds: Normal heart sounds. No murmur heard. ?  No gallop.  ?Pulmonary:  ?   Effort: Pulmonary effort is normal. No respiratory distress.  ?   Breath sounds: Normal breath sounds. No wheezing.  ?Abdominal:  ?   General: Abdomen is flat. Bowel sounds are normal. There is no distension.  ?   Tenderness: There is no abdominal tenderness.  ?Musculoskeletal:  ?   Cervical back: Normal range of motion.  ?   Right lower leg: No edema.  ?   Left lower leg: No edema.  ?Skin: ?   General: Skin is warm.  ?   Capillary Refill: Capillary refill takes less than 2 seconds.  ?   Findings: Bruising present.  ?   Comments: Laceration over right eye- sutures removed.  ?Neurological:  ?   General: No focal deficit present.  ?   Mental Status: She is alert and oriented to person, place, and time. Mental status is at baseline.  ?Psychiatric:     ?   Mood and Affect: Mood normal.     ?   Thought Content: Thought content normal.  ? ? ? ?  ? ?Lab Results  ?Component Value Date  ? WBC 8.3 05/12/2021  ? HGB 14.3 05/12/2021  ? HCT 43.0 05/12/2021  ? PLT 261 05/12/2021  ? GLUCOSE 96 05/12/2021  ? CHOL 269 (H) 12/02/2020  ? TRIG 127 12/02/2020  ? HDL 95 12/02/2020  ? LDLCALC 152 (H) 12/02/2020  ? ALT 16 05/12/2021  ? AST 23 05/12/2021  ? NA 136 05/12/2021  ? K 3.7 05/12/2021  ? CL 100 05/12/2021  ? CREATININE 0.96 05/12/2021  ? BUN 16 05/12/2021  ? CO2 29 05/12/2021  ? TSH 1.760 12/02/2020  ? ? ? ? ?Assessment & Plan:  ? ?Problem List Items Addressed This Visit   ? ?  ? Other  ? Laceration of face ?Laceration healing well and sutures removed, use vitamin e oil or cocoa butter on wound  ? ?Other Visit Diagnoses   ? ? Urinary tract infection without hematuria, site unspecified    -  Primary  ? Relevant Orders  ? POCT URINALYSIS DIP (CLINITEK) (Completed) ?Urinalysis negative ?  ? Syncope and collapse      ? Relevant  Orders  ? Ambulatory referral to Cardiology ?Patient having recurrent syncopal episodes, vasovagal by history, she needs cardiology workup  ? ?  ?. ? ? ? ?Orders Placed This Encounter  ?Procedures  ? Ambulatory referral to Cardiology  ? POCT URINALYSIS DIP (CLINITEK)  ?  ? ?  Follow-up: Return in about 3 weeks (around 06/07/2021) for Dr. cox. ? ?An After Visit Summary was printed and given to the patient. ? ?Reinaldo Meeker, MD ?[facility] ?[phone] ? ?

## 2021-05-17 ENCOUNTER — Other Ambulatory Visit: Payer: Self-pay

## 2021-05-17 ENCOUNTER — Ambulatory Visit (INDEPENDENT_AMBULATORY_CARE_PROVIDER_SITE_OTHER): Payer: Medicare HMO | Admitting: Legal Medicine

## 2021-05-17 ENCOUNTER — Encounter: Payer: Self-pay | Admitting: Legal Medicine

## 2021-05-17 VITALS — BP 122/88 | HR 63 | Temp 98.5°F | Ht 65.0 in | Wt 145.0 lb

## 2021-05-17 DIAGNOSIS — R55 Syncope and collapse: Secondary | ICD-10-CM

## 2021-05-17 DIAGNOSIS — N390 Urinary tract infection, site not specified: Secondary | ICD-10-CM

## 2021-05-17 DIAGNOSIS — S0181XA Laceration without foreign body of other part of head, initial encounter: Secondary | ICD-10-CM | POA: Diagnosis not present

## 2021-05-17 LAB — POCT URINALYSIS DIP (CLINITEK)
Bilirubin, UA: NEGATIVE
Blood, UA: NEGATIVE
Glucose, UA: NEGATIVE mg/dL
Ketones, POC UA: NEGATIVE mg/dL
Leukocytes, UA: NEGATIVE
Nitrite, UA: NEGATIVE
POC PROTEIN,UA: NEGATIVE
Spec Grav, UA: 1.01 (ref 1.010–1.025)
Urobilinogen, UA: 0.2 E.U./dL
pH, UA: 6 (ref 5.0–8.0)

## 2021-05-20 ENCOUNTER — Encounter: Payer: Self-pay | Admitting: Cardiology

## 2021-05-20 ENCOUNTER — Ambulatory Visit: Payer: Medicare HMO | Admitting: Cardiology

## 2021-05-20 VITALS — BP 118/72 | HR 62 | Ht 64.75 in | Wt 146.6 lb

## 2021-05-20 DIAGNOSIS — R55 Syncope and collapse: Secondary | ICD-10-CM | POA: Diagnosis not present

## 2021-05-20 DIAGNOSIS — E782 Mixed hyperlipidemia: Secondary | ICD-10-CM | POA: Diagnosis not present

## 2021-05-20 DIAGNOSIS — R079 Chest pain, unspecified: Secondary | ICD-10-CM | POA: Diagnosis not present

## 2021-05-20 NOTE — Patient Instructions (Signed)
Medication Instructions:  ?Your physician recommends that you continue on your current medications as directed. Please refer to the Current Medication list given to you today. ? ?*If you need a refill on your cardiac medications before your next appointment, please call your pharmacy* ? ? ?Lab Work: ?NONE ?If you have labs (blood work) drawn today and your tests are completely normal, you will receive your results only by: ?MyChart Message (if you have MyChart) OR ?A paper copy in the mail ?If you have any lab test that is abnormal or we need to change your treatment, we will call you to review the results. ? ? ?Testing/Procedures: ?We will order CT coronary calcium score. ?It will cost $99.00 and is not covered by insurance.  ?Please call to schedule.   ? ?[facility]  ?[address]  ?[place], [place] [address] ?[phone] ?           Or ?[facility] ?[address] ?[place], [place] [address] ?[phone] ? ? ? ?Follow-Up: ?At [facility], you and your health needs are our priority.  As part of our continuing mission to provide you with exceptional heart care, we have created designated Provider Care Teams.  These Care Teams include your primary Cardiologist (physician) and Advanced Practice Providers (APPs -  Physician Assistants and Nurse Practitioners) who all work together to provide you with the care you need, when you need it. ? ?We recommend signing up for the patient portal called "MyChart".  Sign up information is provided on this After Visit Summary.  MyChart is used to connect with patients for Virtual Visits (Telemedicine).  Patients are able to view lab/test results, encounter notes, upcoming appointments, etc.  Non-urgent messages can be sent to your provider as well.   ?To learn more about what you can do with MyChart, go to [email or web address].   ? ?Your next appointment:   ?9 month(s) ? ?The format for your next appointment:   ?In Person ? ?Provider:   ?Jyl Heinz, MD  ? ? ?Other Instructions ?  ?

## 2021-05-20 NOTE — Progress Notes (Signed)
?Cardiology Office Note:   ? ?Date:  05/20/2021  ? ?ID:  Kelsey Webb, DOB 1945-03-06, MRN ID:[id number] ? ?PCP:  Rochel Brome, MD  ?Cardiologist:  Jenean Lindau, MD  ? ?Referring MD: Lillard Anes,*  ? ? ?ASSESSMENT:   ? ?1. Mixed hyperlipidemia   ?2. Syncope and collapse   ?3. Chest pain of uncertain etiology   ? ?PLAN:   ? ?In order of problems listed above: ? ?Primary prevention stressed with the patient.  Importance of compliance with diet medication stressed and she vocalized understanding. ?Syncope: I think this is very much of a postural hypotension issue.  She has very mild episodes of this and some at times.  She is never passed out.  Adequate hydration was encouraged and she promises to do better.  I told her to be a little liberal with salt and water in her diet.  She promises to do so.  Fall precautions were advised. ?Chest pain: Atypical in nature and we will do exercise stress echo to reassure her.  She likes to get a rigorous work-up in general and we want to make sure that this is fine for her. ?Mixed dyslipidemia: Markedly elevated LDL.  HDL is also significantly elevated which is a good thing.  For restratification I would like to do a calcium scoring and she is agreeable.  Further recommendations for lipids will be based on this. ?Patient will be seen in follow-up appointment in 9 months or earlier if the patient has any concerns ? ? ? ?Medication Adjustments/Labs and Tests Ordered: ?Current medicines are reviewed at length with the patient today.  Concerns regarding medicines are outlined above.  ?No orders of the defined types were placed in this encounter. ? ?No orders of the defined types were placed in this encounter. ? ? ? ?History of Present Illness:   ? ?Kelsey Webb is a 76 y.o. female who is being seen today for the evaluation of syncope at the request of Lillard Anes,*.  Patient is a pleasant 76 year old female.  She has past medical history that is not much  significant.  She has mixed dyslipidemia.  Overall she is a healthy female.  She exercises on a regular basis.  She was in the bathroom and passed out.  She mentioned to me that she had been the previous evening at 5 and subsequently at 7 in the morning when she had a hot shower she felt dizzy and passed out and hit her head and had a laceration.  She did not eat or drink anything for the past 14 hours before that.  Originally she mentions to me in general her blood pressure is borderline.  This is a routine for her.  No chest pain at this time.  She has chest pain occasionally and this is not related to exertion substernal.  No radiation to the neck or to the arms.  At the time of my evaluation, the patient is alert awake oriented and in no distress. ? ?Past Medical History:  ?Diagnosis Date  ? Acute gastric ulcer with bleeding 05/26/2020  ? Gastric ulcer   ? Migraine   ? Wrist fracture   ? ? ?Past Surgical History:  ?Procedure Laterality Date  ? CATARACT EXTRACTION    ? ? ?Current Medications: ?Current Meds  ?Medication Sig  ? Biotin 1000 MCG tablet 1,000 mcg daily.  ? Calcium 500 MG tablet 500 mg daily.  ? Ferrous Sulfate (IRON) 325 (65 Fe) MG TABS Take 1 tablet by  mouth 2 (two) times a week.  ?  ? ?Allergies:   Nsaids and Amoxicillin-pot clavulanate  ? ?Social History  ? ?Socioeconomic History  ? Marital status: Married  ?  Spouse name: Not on file  ? Number of children: 2  ? Years of education: Not on file  ? Highest education level: Not on file  ?Occupational History  ? Occupation: Retired  ?Tobacco Use  ? Smoking status: Never  ? Smokeless tobacco: Never  ?Vaping Use  ? Vaping Use: Never used  ?Substance and Sexual Activity  ? Alcohol use: Not Currently  ? Drug use: Never  ? Sexual activity: Not Currently  ?Other Topics Concern  ? Not on file  ?Social History Narrative  ? Not on file  ? ?Social Determinants of Health  ? ?Financial Resource Strain: Not on file  ?Food Insecurity: Not on file  ?Transportation  Needs: Not on file  ?Physical Activity: Not on file  ?Stress: Not on file  ?Social Connections: Not on file  ?  ? ?Family History: ?The patient's family history includes Diabetes in her sister; Emphysema in her father; Other in her mother. ? ?ROS:   ?Please see the history of present illness.    ?All other systems reviewed and are negative. ? ?EKGs/Labs/Other Studies Reviewed:   ? ?The following studies were reviewed today: ?EKG reveals sinus rhythm and nonspecific ST-T changes ? ? ?Recent Labs: ?12/02/2020: TSH 1.760 ?05/12/2021: ALT 16; BUN 16; Creatinine, Ser 0.96; Hemoglobin 14.3; Platelets 261; Potassium 3.7; Sodium 136  ?Recent Lipid Panel ?   ?Component Value Date/Time  ? CHOL 269 (H) 12/02/2020 1011  ? TRIG 127 12/02/2020 1011  ? HDL 95 12/02/2020 1011  ? CHOLHDL 2.8 12/02/2020 1011  ? LDLCALC 152 (H) 12/02/2020 1011  ? ? ?Physical Exam:   ? ?VS:  BP 118/72 (BP Location: Left Arm, Patient Position: Sitting)   Pulse 62   Ht 5' 4.75" (1.645 m)   Wt 146 lb 9.6 oz (66.5 kg)   SpO2 99%   BMI 24.58 kg/m?    ? ?Wt Readings from Last 3 Encounters:  ?05/20/21 146 lb 9.6 oz (66.5 kg)  ?05/17/21 145 lb (65.8 kg)  ?05/12/21 140 lb (63.5 kg)  ?  ? ?GEN: Patient is in no acute distress ?HEENT: Normal ?NECK: No JVD; No carotid bruits ?LYMPHATICS: No lymphadenopathy ?CARDIAC: S1 S2 regular, 2/6 systolic murmur at the apex. ?RESPIRATORY:  Clear to auscultation without rales, wheezing or rhonchi  ?ABDOMEN: Soft, non-tender, non-distended ?MUSCULOSKELETAL:  No edema; No deformity  ?SKIN: Warm and dry ?NEUROLOGIC:  Alert and oriented x 3 ?PSYCHIATRIC:  Normal affect  ? ? ?Signed, ?Jenean Lindau, MD  ?05/20/2021 11:04 AM    ?[place]   ?

## 2021-05-30 ENCOUNTER — Telehealth: Payer: Self-pay | Admitting: Cardiology

## 2021-05-30 NOTE — Telephone Encounter (Signed)
Left VM for pt to callback and sent message in MyChart. Spoke with Benna Dunks. ?

## 2021-05-30 NOTE — Telephone Encounter (Signed)
Taryn with [facility] called and stated she has called this pt Multiple time sto sch CT. She would like to know what needs to be done with this order since she can not reach the pt/  ? ?Best number  for Benna Dunks [phone] ?

## 2021-10-20 ENCOUNTER — Ambulatory Visit (INDEPENDENT_AMBULATORY_CARE_PROVIDER_SITE_OTHER): Payer: Medicare HMO | Admitting: Family Medicine

## 2021-10-20 ENCOUNTER — Encounter: Payer: Self-pay | Admitting: Family Medicine

## 2021-10-20 VITALS — BP 128/72 | HR 74 | Temp 97.5°F | Ht 65.0 in | Wt 147.0 lb

## 2021-10-20 DIAGNOSIS — R2232 Localized swelling, mass and lump, left upper limb: Secondary | ICD-10-CM

## 2021-10-20 NOTE — Progress Notes (Signed)
Acute Office Visit  Subjective:    Patient ID: Kelsey Webb, female    DOB: 31-Mar-1945, 76 y.o.   MRN: [id number]  Chief Complaint  Patient presents with   Lump under left forearm    Been there for a few years/ now tender and hard    HPI Patient is in today for Left forearm knot. Patient fell a couple of months ago. She is concerned it may have been a bruised area in the region of her left forearm. Eating healthy, but not exercising.    Past Medical History:  Diagnosis Date   Acute gastric ulcer with bleeding 05/26/2020   Gastric ulcer    Migraine    Wrist fracture     Past Surgical History:  Procedure Laterality Date   CATARACT EXTRACTION      Family History  Problem Relation Age of Onset   Other Mother        aging   Emphysema Father    Diabetes Sister     Social History   Socioeconomic History   Marital status: Married    Spouse name: Not on file   Number of children: 2   Years of education: Not on file   Highest education level: Not on file  Occupational History   Occupation: Retired  Tobacco Use   Smoking status: Never   Smokeless tobacco: Never  Vaping Use   Vaping Use: Never used  Substance and Sexual Activity   Alcohol use: Not Currently   Drug use: Never   Sexual activity: Not Currently  Other Topics Concern   Not on file  Social History Narrative   Not on file   Social Determinants of Health   Financial Resource Strain: Not on file  Food Insecurity: Not on file  Transportation Needs: Not on file  Physical Activity: Not on file  Stress: Not on file  Social Connections: Not on file  Intimate Partner Violence: Not on file    Outpatient Medications Prior to Visit  Medication Sig Dispense Refill   Biotin 1000 MCG tablet 1,000 mcg daily.     Calcium 500 MG tablet 500 mg daily.     Ferrous Sulfate (IRON) 325 (65 Fe) MG TABS Take 1 tablet by mouth 2 (two) times a week.     Multiple Vitamins-Minerals (MULTIVITAMIN ADULTS PO) Take 1  tablet by mouth daily.     No facility-administered medications prior to visit.    Allergies  Allergen Reactions   Nsaids Hives   Amoxicillin-Pot Clavulanate Itching, Rash and Other (See Comments)    Review of Systems  Constitutional:  Negative for appetite change, fatigue and fever.  HENT:  Negative for congestion, ear pain, sinus pressure and sore throat.   Respiratory:  Negative for cough, chest tightness, shortness of breath and wheezing.   Cardiovascular:  Negative for chest pain and palpitations.  Gastrointestinal:  Negative for abdominal pain, constipation, diarrhea, nausea and vomiting.  Genitourinary:  Negative for dysuria and hematuria.  Musculoskeletal:  Negative for arthralgias, back pain, joint swelling and myalgias.  Skin:  Negative for rash.       Lump under left forearm. Tender/hard to the touch.  Neurological:  Negative for dizziness, weakness and headaches.  Psychiatric/Behavioral:  Negative for dysphoric mood. The patient is not nervous/anxious.        Objective:    Physical Exam Vitals reviewed.  Constitutional:      Appearance: Normal appearance.  Musculoskeletal:        General: No swelling.  Skin:    Findings: Lesion (left posterior forearm. mobile nodule with hard area (calcium nodule. )) present.  Neurological:     Mental Status: She is alert and oriented to person, place, and time.  Psychiatric:        Mood and Affect: Mood normal.        Behavior: Behavior is uncooperative.     BP 128/72 (BP Location: Left Arm, Patient Position: Sitting)   Pulse 74   Temp (!) 97.5 F (36.4 C) (Temporal)   Ht $R'5\' 5"'TK$  (1.651 m)   Wt 147 lb (66.7 kg)   SpO2 97%   BMI 24.46 kg/m  Wt Readings from Last 3 Encounters:  10/20/21 147 lb (66.7 kg)  05/20/21 146 lb 9.6 oz (66.5 kg)  05/17/21 145 lb (65.8 kg)    Health Maintenance Due  Topic Date Due   Hepatitis C Screening  Never done   TETANUS/TDAP  Never done   COLONOSCOPY (Pts 45-48yrs Insurance coverage  will need to be confirmed)  Never done   MAMMOGRAM  Never done   Zoster Vaccines- Shingrix (1 of 2) Never done   DEXA SCAN  Never done   COVID-19 Vaccine (4 - Pfizer series) 04/22/2021   INFLUENZA VACCINE  09/20/2021    There are no preventive care reminders to display for this patient.   Lab Results  Component Value Date   TSH 1.760 12/02/2020   Lab Results  Component Value Date   WBC 8.3 05/12/2021   HGB 14.3 05/12/2021   HCT 43.0 05/12/2021   MCV 93.5 05/12/2021   PLT 261 05/12/2021   Lab Results  Component Value Date   NA 136 05/12/2021   K 3.7 05/12/2021   CO2 29 05/12/2021   GLUCOSE 96 05/12/2021   BUN 16 05/12/2021   CREATININE 0.96 05/12/2021   BILITOT 0.5 05/12/2021   ALKPHOS 73 05/12/2021   AST 23 05/12/2021   ALT 16 05/12/2021   PROT 6.9 05/12/2021   ALBUMIN 4.1 05/12/2021   CALCIUM 9.5 05/12/2021   ANIONGAP 7 05/12/2021   EGFR 62 12/02/2020   Lab Results  Component Value Date   CHOL 269 (H) 12/02/2020   Lab Results  Component Value Date   HDL 95 12/02/2020   Lab Results  Component Value Date   LDLCALC 152 (H) 12/02/2020   Lab Results  Component Value Date   TRIG 127 12/02/2020   Lab Results  Component Value Date   CHOLHDL 2.8 12/02/2020   No results found for: "HGBA1C"       Assessment & Plan:   Problem List Items Addressed This Visit       Other   Subcutaneous nodule of left forearm - Primary    Reassurance given. If worsens and becomes larger, we can excise it.         I,Lauren M Auman,acting as a scribe for Rochel Brome, MD.,have documented all relevant documentation on the behalf of Rochel Brome, MD,as directed by  Rochel Brome, MD while in the presence of Rochel Brome, MD.   Rochel Brome, MD

## 2021-10-24 DIAGNOSIS — R2232 Localized swelling, mass and lump, left upper limb: Secondary | ICD-10-CM | POA: Insufficient documentation

## 2021-10-24 NOTE — Assessment & Plan Note (Addendum)
Reassurance given. If worsens and becomes larger, we can excise it.

## 2022-01-06 ENCOUNTER — Encounter: Payer: Self-pay | Admitting: Family Medicine

## 2022-01-06 ENCOUNTER — Ambulatory Visit (INDEPENDENT_AMBULATORY_CARE_PROVIDER_SITE_OTHER): Payer: Medicare HMO | Admitting: Family Medicine

## 2022-01-06 VITALS — BP 118/70 | HR 73 | Temp 98.2°F | Resp 14 | Ht 65.0 in | Wt 148.0 lb

## 2022-01-06 DIAGNOSIS — E782 Mixed hyperlipidemia: Secondary | ICD-10-CM

## 2022-01-06 NOTE — Progress Notes (Signed)
Subjective:  Patient ID: Kelsey Webb, female    DOB: 1945/08/22  Age: 76 y.o. MRN: [id number]  Chief Complaint  Patient presents with   Hyperlipidemia    HPI Hyperlipidemia: Patient eats low fat food and she makes healthy choices to eat, and she exercises one hour x 4 times a week. Patient does not want any cholesterol medicines.  Refuses dexa and mammogram. Takes calcium daily and drinks milk daily.    Current Outpatient Medications on File Prior to Visit  Medication Sig Dispense Refill   Biotin 1000 MCG tablet 1,000 mcg daily.     Calcium 500 MG tablet 500 mg daily.     Ferrous Sulfate (IRON) 325 (65 Fe) MG TABS Take 1 tablet by mouth 2 (two) times a week.     Multiple Vitamins-Minerals (MULTIVITAMIN ADULTS PO) Take 1 tablet by mouth daily.     No current facility-administered medications on file prior to visit.   Past Medical History:  Diagnosis Date   Acute gastric ulcer with bleeding 05/26/2020   Gastric ulcer    Migraine    Wrist fracture    Past Surgical History:  Procedure Laterality Date   CATARACT EXTRACTION      Family History  Problem Relation Age of Onset   Other Mother        aging   Emphysema Father    Diabetes Sister    Social History   Socioeconomic History   Marital status: Married    Spouse name: Not on file   Number of children: 2   Years of education: Not on file   Highest education level: Not on file  Occupational History   Occupation: Retired  Tobacco Use   Smoking status: Never   Smokeless tobacco: Never  Vaping Use   Vaping Use: Never used  Substance and Sexual Activity   Alcohol use: Not Currently   Drug use: Never   Sexual activity: Not Currently  Other Topics Concern   Not on file  Social History Narrative   Not on file   Social Determinants of Health   Financial Resource Strain: Not on file  Food Insecurity: Not on file  Transportation Needs: Not on file  Physical Activity: Not on file  Stress: Not on file  Social  Connections: Not on file    Review of Systems  Constitutional:  Negative for chills, fatigue and fever.  HENT:  Negative for congestion, ear pain and sore throat.   Respiratory:  Negative for cough and shortness of breath.   Cardiovascular:  Negative for chest pain and palpitations.  Gastrointestinal:  Negative for abdominal pain, constipation, diarrhea, nausea and vomiting.  Endocrine: Negative for polydipsia, polyphagia and polyuria.  Genitourinary:  Negative for difficulty urinating and dysuria.  Musculoskeletal:  Negative for arthralgias, back pain and myalgias.  Skin:  Negative for rash.  Neurological:  Negative for headaches.  Psychiatric/Behavioral:  Negative for dysphoric mood. The patient is not nervous/anxious.      Objective:  BP 118/70   Pulse 73   Temp 98.2 F (36.8 C)   Resp 14   Ht 5\' 5"  (1.651 m)   Wt 148 lb (67.1 kg)   SpO2 98%   BMI 24.63 kg/m      01/06/2022    8:40 AM 10/20/2021    3:39 PM 05/20/2021   10:38 AM  BP/Weight  Systolic BP 118 128 118  Diastolic BP 70 72 72  Wt. (Lbs) 148 147 146.6  BMI 24.63 kg/m2 24.46 kg/m2  24.58 kg/m2    Physical Exam Vitals reviewed.  Constitutional:      Appearance: Normal appearance. She is normal weight.  Neck:     Vascular: No carotid bruit.  Cardiovascular:     Rate and Rhythm: Normal rate and regular rhythm.     Heart sounds: Normal heart sounds.  Pulmonary:     Effort: Pulmonary effort is normal. No respiratory distress.     Breath sounds: Normal breath sounds.  Abdominal:     General: Abdomen is flat. Bowel sounds are normal.     Palpations: Abdomen is soft.     Tenderness: There is no abdominal tenderness.  Neurological:     Mental Status: She is alert and oriented to person, place, and time.  Psychiatric:        Mood and Affect: Mood normal.        Behavior: Behavior normal.     Diabetic Foot Exam - Simple   No data filed      Lab Results  Component Value Date   WBC 5.3 01/06/2022    HGB 15.0 01/06/2022   HCT 46.8 (H) 01/06/2022   PLT 327 01/06/2022   GLUCOSE 95 01/06/2022   CHOL 279 (H) 01/06/2022   TRIG 99 01/06/2022   HDL 98 01/06/2022   LDLCALC 165 (H) 01/06/2022   ALT 16 01/06/2022   AST 20 01/06/2022   NA 138 01/06/2022   K 5.0 01/06/2022   CL 98 01/06/2022   CREATININE 0.98 01/06/2022   BUN 14 01/06/2022   CO2 27 01/06/2022   TSH 1.760 12/02/2020      Assessment & Plan:   Problem List Items Addressed This Visit       Other   Mixed hyperlipidemia - Primary    Patient refuses all cholesterol medicine. Continue low fat diet and exercise. Recommend eat baked, broiled, or grilled fish three times a week. Salmon, Trout, [place], and tuna.  This really helps more with triglycerides.   Alternatively she could consider red yeast rice and coenzyme q10. Check labs.       Relevant Orders   Comprehensive metabolic panel (Completed)   Lipid panel (Completed)   CBC with Differential/Platelet (Completed)   Cardiovascular Risk Assessment (Completed)  .  No orders of the defined types were placed in this encounter.   Orders Placed This Encounter  Procedures   Comprehensive metabolic panel   Lipid panel   CBC with Differential/Platelet   Cardiovascular Risk Assessment     Follow-up: Return in about 1 year (around 01/07/2023) for awv fasting with Dr. Sedalia Muta.  An After Visit Summary was printed and given to the patient.  Blane Ohara, MD [facility] [phone]

## 2022-01-07 LAB — CBC WITH DIFFERENTIAL/PLATELET
Basophils Absolute: 0.1 10*3/uL (ref 0.0–0.2)
Basos: 1 %
EOS (ABSOLUTE): 0.1 10*3/uL (ref 0.0–0.4)
Eos: 2 %
Hematocrit: 46.8 % — ABNORMAL HIGH (ref 34.0–46.6)
Hemoglobin: 15 g/dL (ref 11.1–15.9)
Immature Grans (Abs): 0 10*3/uL (ref 0.0–0.1)
Immature Granulocytes: 0 %
Lymphocytes Absolute: 1.2 10*3/uL (ref 0.7–3.1)
Lymphs: 23 %
MCH: 30.2 pg (ref 26.6–33.0)
MCHC: 32.1 g/dL (ref 31.5–35.7)
MCV: 94 fL (ref 79–97)
Monocytes Absolute: 0.4 10*3/uL (ref 0.1–0.9)
Monocytes: 8 %
Neutrophils Absolute: 3.5 10*3/uL (ref 1.4–7.0)
Neutrophils: 66 %
Platelets: 327 10*3/uL (ref 150–450)
RBC: 4.96 x10E6/uL (ref 3.77–5.28)
RDW: 12.4 % (ref 11.7–15.4)
WBC: 5.3 10*3/uL (ref 3.4–10.8)

## 2022-01-07 LAB — COMPREHENSIVE METABOLIC PANEL
ALT: 16 IU/L (ref 0–32)
AST: 20 IU/L (ref 0–40)
Albumin/Globulin Ratio: 1.7 (ref 1.2–2.2)
Albumin: 4.5 g/dL (ref 3.8–4.8)
Alkaline Phosphatase: 90 IU/L (ref 44–121)
BUN/Creatinine Ratio: 14 (ref 12–28)
BUN: 14 mg/dL (ref 8–27)
Bilirubin Total: 0.6 mg/dL (ref 0.0–1.2)
CO2: 27 mmol/L (ref 20–29)
Calcium: 9.7 mg/dL (ref 8.7–10.3)
Chloride: 98 mmol/L (ref 96–106)
Creatinine, Ser: 0.98 mg/dL (ref 0.57–1.00)
Globulin, Total: 2.7 g/dL (ref 1.5–4.5)
Glucose: 95 mg/dL (ref 70–99)
Potassium: 5 mmol/L (ref 3.5–5.2)
Sodium: 138 mmol/L (ref 134–144)
Total Protein: 7.2 g/dL (ref 6.0–8.5)
eGFR: 61 mL/min/{1.73_m2} (ref >59)

## 2022-01-07 LAB — LIPID PANEL
Chol/HDL Ratio: 2.8 ratio (ref 0.0–4.4)
Cholesterol, Total: 279 mg/dL — ABNORMAL HIGH (ref 100–199)
HDL: 98 mg/dL (ref >39)
LDL Chol Calc (NIH): 165 mg/dL — ABNORMAL HIGH (ref 0–99)
Triglycerides: 99 mg/dL (ref 0–149)
VLDL Cholesterol Cal: 16 mg/dL (ref 5–40)

## 2022-01-07 LAB — CARDIOVASCULAR RISK ASSESSMENT

## 2022-01-08 NOTE — Progress Notes (Signed)
Blood count normal.  Liver function normal.  Kidney function normal.  Cholesterol: LDL very high at 165. Patient refuses all cholesterol medicine. Continue low fat diet and exercise. Recommend eat baked, broiled, or grilled fish three times a week. Salmon, Trout, [place], and tuna.  This really helps more with triglycerides.   Alternatively she could consider red yeast rice and coenzyme q10.

## 2022-01-15 NOTE — Assessment & Plan Note (Signed)
Patient refuses all cholesterol medicine. Continue low fat diet and exercise. Recommend eat baked, broiled, or grilled fish three times a week. Salmon, Trout, [place], and tuna.  This really helps more with triglycerides.   Alternatively she could consider red yeast rice and coenzyme q10. Check labs.

## 2022-04-03 ENCOUNTER — Telehealth: Payer: Self-pay

## 2022-04-03 NOTE — Telephone Encounter (Signed)
I left a message on the number(s) listed in the patients chart requesting the patient to call back regarding the [facility] appointment for 01/08/2023. The provider is out of the office that day. The appointment has been canceled. Waiting for the patient to return the call.

## 2022-05-02 DIAGNOSIS — H52223 Regular astigmatism, bilateral: Secondary | ICD-10-CM | POA: Diagnosis not present

## 2022-05-02 DIAGNOSIS — H35341 Macular cyst, hole, or pseudohole, right eye: Secondary | ICD-10-CM | POA: Diagnosis not present

## 2022-09-18 DIAGNOSIS — L814 Other melanin hyperpigmentation: Secondary | ICD-10-CM | POA: Diagnosis not present

## 2022-09-18 DIAGNOSIS — L820 Inflamed seborrheic keratosis: Secondary | ICD-10-CM | POA: Diagnosis not present

## 2022-09-18 DIAGNOSIS — L821 Other seborrheic keratosis: Secondary | ICD-10-CM | POA: Diagnosis not present

## 2022-09-18 DIAGNOSIS — I781 Nevus, non-neoplastic: Secondary | ICD-10-CM | POA: Diagnosis not present

## 2022-09-18 DIAGNOSIS — D225 Melanocytic nevi of trunk: Secondary | ICD-10-CM | POA: Diagnosis not present

## 2022-09-18 DIAGNOSIS — L579 Skin changes due to chronic exposure to nonionizing radiation, unspecified: Secondary | ICD-10-CM | POA: Diagnosis not present

## 2022-09-18 DIAGNOSIS — D235 Other benign neoplasm of skin of trunk: Secondary | ICD-10-CM | POA: Diagnosis not present

## 2022-12-07 DIAGNOSIS — Z0100 Encounter for examination of eyes and vision without abnormal findings: Secondary | ICD-10-CM | POA: Diagnosis not present

## 2023-01-08 ENCOUNTER — Ambulatory Visit: Payer: Medicare HMO | Admitting: Family Medicine

## 2023-01-15 NOTE — Progress Notes (Unsigned)
Subjective:   Kelsey Webb is a 77 y.o. female who presents for Medicare Annual (Subsequent) preventive examination.  Visit Complete: {[phone]}  Patient Medicare AWV questionnaire was completed by the patient on ***; I have confirmed that all information answered by patient is correct and no changes since this date.        Objective:    There were no vitals filed for this visit. There is no height or weight on file to calculate BMI.     05/12/2021   10:12 AM 04/03/2019   11:17 AM 04/12/2015    9:27 AM  Advanced Directives  Does Patient Have a Medical Advance Directive? Yes Yes No  Type of Advance Directive Living will;Healthcare Power of Attorney    Would patient like information on creating a medical advance directive?   No - patient declined information    Current Medications (verified) Outpatient Encounter Medications as of 01/16/2023  Medication Sig   Biotin 1000 MCG tablet 1,000 mcg daily.   Calcium 500 MG tablet 500 mg daily.   Ferrous Sulfate (IRON) 325 (65 Fe) MG TABS Take 1 tablet by mouth 2 (two) times a week.   Multiple Vitamins-Minerals (MULTIVITAMIN ADULTS PO) Take 1 tablet by mouth daily.   No facility-administered encounter medications on file as of 01/16/2023.    Allergies (verified) Nsaids and Amoxicillin-pot clavulanate   History: Past Medical History:  Diagnosis Date   Acute gastric ulcer with bleeding 05/26/2020   Gastric ulcer    Migraine    Wrist fracture    Past Surgical History:  Procedure Laterality Date   CATARACT EXTRACTION     Family History  Problem Relation Age of Onset   Other Mother        aging   Emphysema Father    Diabetes Sister    Social History   Socioeconomic History   Marital status: Married    Spouse name: Not on file   Number of children: 2   Years of education: Not on file   Highest education level: Not on file  Occupational History   Occupation: Retired  Tobacco Use   Smoking status:  Never   Smokeless tobacco: Never  Vaping Use   Vaping status: Never Used  Substance and Sexual Activity   Alcohol use: Not Currently   Drug use: Never   Sexual activity: Not Currently  Other Topics Concern   Not on file  Social History Narrative   Not on file   Social Determinants of Health   Financial Resource Strain: Not on file  Food Insecurity: Not on file  Transportation Needs: Not on file  Physical Activity: Not on file  Stress: Not on file  Social Connections: Not on file    Tobacco Counseling Counseling given: Not Answered   Clinical Intake:              How often do you need to have someone help you when you read instructions, pamphlets, or other written materials from your doctor or pharmacy?: (P) 1 - Never         Activities of Daily Living    01/12/2023    6:50 PM  In your present state of health, do you have any difficulty performing the following activities:  Hearing? 0  Vision? 0  Difficulty concentrating or making decisions? 0  Walking or climbing stairs? 0  Dressing or bathing? 0  Doing errands, shopping? 0  Preparing Food and eating ? N  Using the Toilet? N  In  the past six months, have you accidently leaked urine? N  Do you have problems with loss of bowel control? N  Managing your Medications? N  Managing your Finances? N  Housekeeping or managing your Housekeeping? N    Patient Care Team: CoxFritzi Mandes, MD as PCP - General (Family Medicine)  Indicate any recent Medical Services you may have received from other than [facility] providers in the past year (date may be approximate).     Assessment:   This is a routine wellness examination for Kelsey Webb.  Hearing/Vision screen No results found.   Goals Addressed   None   Depression Screen    05/17/2021   11:02 AM 05/26/2020   10:19 AM  PHQ 2/9 Scores  PHQ - 2 Score 0 0    Fall Risk    01/12/2023    6:50 PM 05/17/2021   11:01 AM  Fall Risk   Falls in the past year? 0 1   Number falls in past yr:  0  Injury with Fall?  1    MEDICARE RISK AT HOME: Medicare Risk at Home Any stairs in or around the home?: (P) Yes If so, are there any without handrails?: (P) No Home free of loose throw rugs in walkways, pet beds, electrical cords, etc?: (P) Yes Adequate lighting in your home to reduce risk of falls?: (P) Yes Life alert?: (P) No Use of a cane, walker or w/c?: (P) No Grab bars in the bathroom?: (P) Yes Shower chair or bench in shower?: (P) Yes Elevated toilet seat or a handicapped toilet?: (P) Yes  TIMED UP AND GO:  Was the test performed?  {AMBTIMEDUPGO:[phone]}    Cognitive Function:        Immunizations Immunization History  Administered Date(s) Administered   Fluad Quad(high Dose 65+) 12/02/2020   Influenza-Unspecified 11/20/2017, 10/31/2021   PFIZER(Purple Top)SARS-COV-2 Vaccination 04/27/2019, 05/28/2019   PNEUMOCOCCAL CONJUGATE-20 12/02/2020   Pfizer Covid-19 Vaccine Bivalent Booster 50yrs & up 12/23/2020   Tdap 08/05/2018   Zoster Recombinant(Shingrix) 10/31/2021, 12/28/2021    {TDAP status:2101805}  {Flu Vaccine status:2101806}  {Pneumococcal vaccine status:2101807}  {Covid-19 vaccine status:2101808}  Qualifies for Shingles Vaccine? {YES/NO:21197}  Zostavax completed {YES/NO:21197}  {Shingrix Completed?:2101804}  Screening Tests Health Maintenance  Topic Date Due   Hepatitis C Screening  Never done   Colonoscopy  Never done   DEXA SCAN  Never done   INFLUENZA VACCINE  09/21/2022   COVID-19 Vaccine (4 - 2023-24 season) 10/22/2022   Medicare Annual Wellness (AWV)  01/16/2024   DTaP/Tdap/Td (2 - Td or Tdap) 08/04/2028   Pneumonia Vaccine 41+ Years old  Completed   Zoster Vaccines- Shingrix  Completed   HPV VACCINES  Aged Out    Health Maintenance  Health Maintenance Due  Topic Date Due   Hepatitis C Screening  Never done   Colonoscopy  Never done   DEXA SCAN  Never done   INFLUENZA VACCINE  09/21/2022    COVID-19 Vaccine (4 - 2023-24 season) 10/22/2022    {Colorectal cancer screening:2101809}  {Mammogram status:21018020}  {Bone Density status:21018021}  Lung Cancer Screening: (Low Dose CT Chest recommended if Age 79-80 years, 20 pack-year currently smoking OR have quit w/in 15years.) {DOES NOT does:27190::"does not"} qualify.   Lung Cancer Screening Referral: ***  Additional Screening:  Hepatitis C Screening: {DOES NOT does:27190::"does not"} qualify; Completed ***  Vision Screening: Recommended annual ophthalmology exams for early detection of glaucoma and other disorders of the eye. Is the patient up to date with their annual  eye exam?  {YES/NO:21197} Who is the provider or what is the name of the office in which the patient attends annual eye exams? *** If pt is not established with a provider, would they like to be referred to a provider to establish care? {YES/NO:21197}.   Dental Screening: Recommended annual dental exams for proper oral hygiene  Diabetic Foot Exam: {Diabetic Foot Exam:2101802}  Community Resource Referral / Chronic Care Management: CRR required this visit?  {YES/NO:21197}  CCM required this visit?  {CCM Required choices:[id number]}     Plan:     I have personally reviewed and noted the following in the patient's chart:   Medical and social history Use of alcohol, tobacco or illicit drugs  Current medications and supplements including opioid prescriptions. {Opioid Prescriptions:[phone]} Functional ability and status Nutritional status Physical activity Advanced directives List of other physicians Hospitalizations, surgeries, and ER visits in previous 12 months Vitals Screenings to include cognitive, depression, and falls Referrals and appointments  In addition, I have reviewed and discussed with patient certain preventive protocols, quality metrics, and best practice recommendations. A written personalized care plan for preventive services  as well as general preventive health recommendations were provided to patient.     Eugenie Norrie, CMA   01/15/2023   After Visit Summary: {CHL AMB AWV After Visit [phone]}  Nurse Notes: ***

## 2023-01-15 NOTE — Patient Instructions (Signed)

## 2023-01-16 ENCOUNTER — Ambulatory Visit: Payer: Medicare HMO | Admitting: Family Medicine

## 2023-01-16 VITALS — BP 118/74 | HR 71 | Temp 97.8°F | Ht 65.0 in | Wt 146.0 lb

## 2023-01-16 DIAGNOSIS — Z0000 Encounter for general adult medical examination without abnormal findings: Secondary | ICD-10-CM

## 2023-01-16 DIAGNOSIS — E785 Hyperlipidemia, unspecified: Secondary | ICD-10-CM | POA: Insufficient documentation

## 2023-01-16 LAB — COMPREHENSIVE METABOLIC PANEL
ALT: 13 [IU]/L (ref 0–32)
AST: 24 [IU]/L (ref 0–40)
Albumin: 4.3 g/dL (ref 3.8–4.8)
Alkaline Phosphatase: 91 [IU]/L (ref 44–121)
BUN/Creatinine Ratio: 12 (ref 12–28)
BUN: 13 mg/dL (ref 8–27)
Bilirubin Total: 0.5 mg/dL (ref 0.0–1.2)
CO2: 25 mmol/L (ref 20–29)
Calcium: 9.5 mg/dL (ref 8.7–10.3)
Chloride: 99 mmol/L (ref 96–106)
Creatinine, Ser: 1.07 mg/dL — ABNORMAL HIGH (ref 0.57–1.00)
Globulin, Total: 2.6 g/dL (ref 1.5–4.5)
Glucose: 98 mg/dL (ref 70–99)
Potassium: 5 mmol/L (ref 3.5–5.2)
Sodium: 139 mmol/L (ref 134–144)
Total Protein: 6.9 g/dL (ref 6.0–8.5)
eGFR: 54 mL/min/{1.73_m2} — ABNORMAL LOW (ref >59)

## 2023-01-16 LAB — LIPID PANEL
Chol/HDL Ratio: 2.9 {ratio} (ref 0.0–4.4)
Cholesterol, Total: 268 mg/dL — ABNORMAL HIGH (ref 100–199)
HDL: 94 mg/dL (ref >39)
LDL Chol Calc (NIH): 154 mg/dL — ABNORMAL HIGH (ref 0–99)
Triglycerides: 120 mg/dL (ref 0–149)
VLDL Cholesterol Cal: 20 mg/dL (ref 5–40)

## 2023-09-25 IMAGING — CR DG FOREARM 2V*R*
2 series · 2 of 2 positions shown · non-contrast
Comparison: None.

CLINICAL DATA: Trauma, pain

EXAM:
RIGHT FOREARM - 2 VIEW

[x forearm ap right]
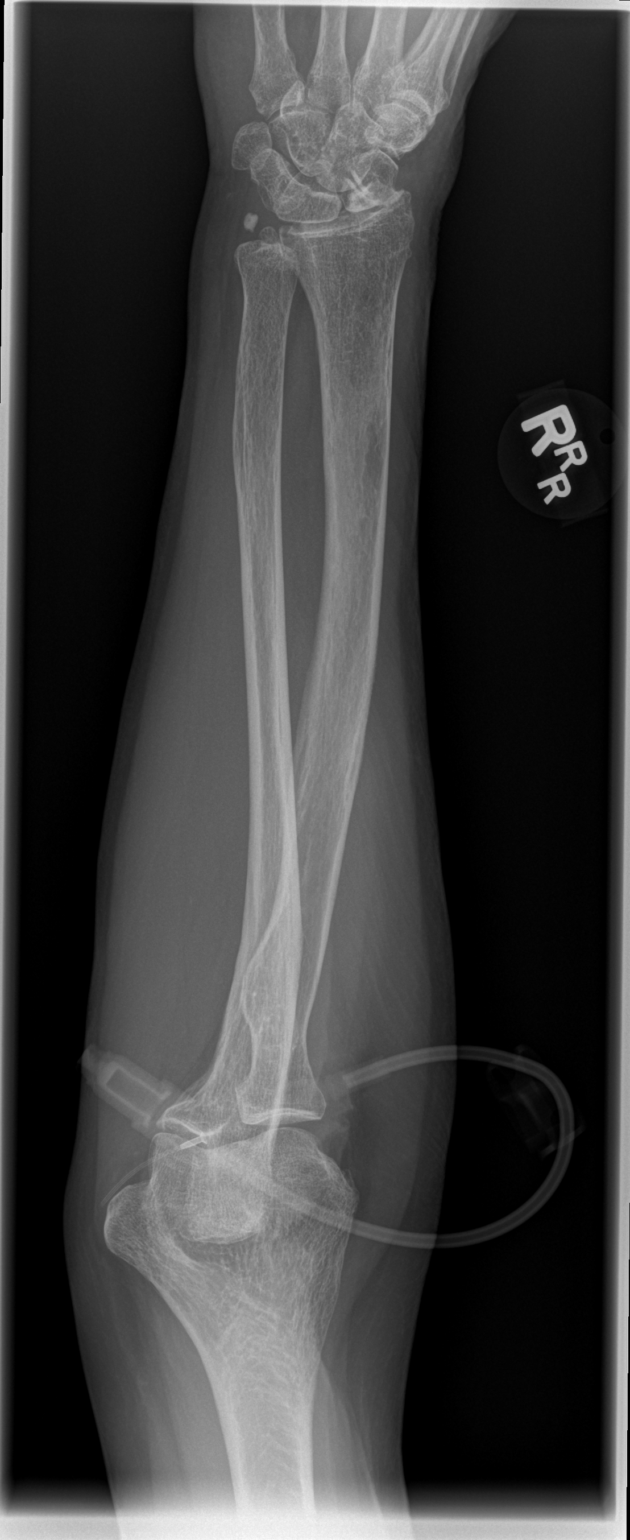

[x forearm lat right]
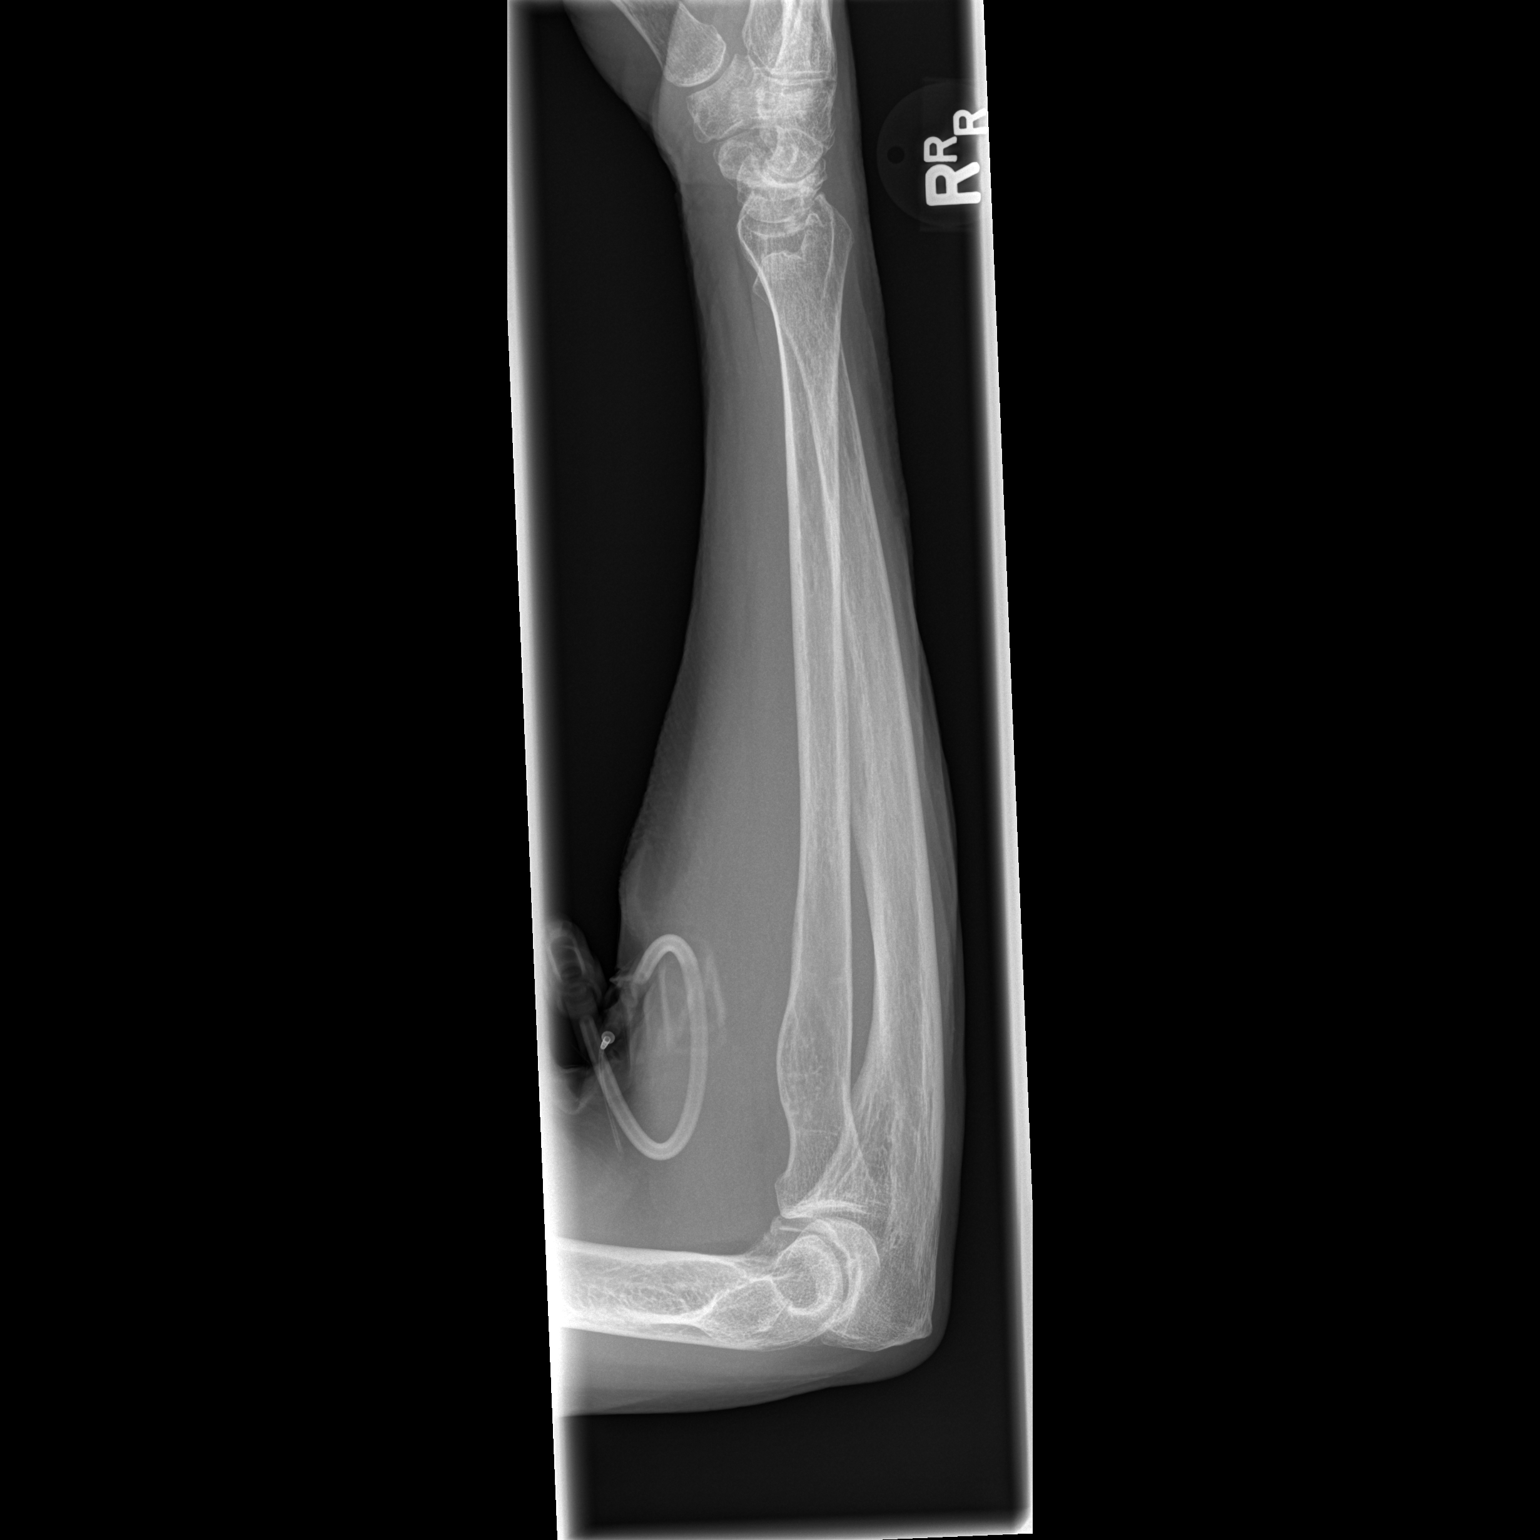

[2 of 2 positions shown; findings below may reference images not displayed]

FINDINGS: No recent fracture or dislocation is seen. Small smooth marginated
calcification is noted adjacent to the ulnar styloid, possibly
residual from previous injury. Degenerative changes are noted with
joint space narrowing and bony spurs in the radiocarpal joints in
the medial aspect.
IMPRESSION: No recent fracture or dislocation is seen in the right forearm.
Degenerative changes are noted in the radiocarpal joint.

## 2023-10-10 DIAGNOSIS — H52223 Regular astigmatism, bilateral: Secondary | ICD-10-CM | POA: Diagnosis not present

## 2023-10-10 DIAGNOSIS — Z0100 Encounter for examination of eyes and vision without abnormal findings: Secondary | ICD-10-CM | POA: Diagnosis not present

## 2023-10-15 DIAGNOSIS — L579 Skin changes due to chronic exposure to nonionizing radiation, unspecified: Secondary | ICD-10-CM | POA: Diagnosis not present

## 2023-10-15 DIAGNOSIS — L814 Other melanin hyperpigmentation: Secondary | ICD-10-CM | POA: Diagnosis not present

## 2023-10-15 DIAGNOSIS — L821 Other seborrheic keratosis: Secondary | ICD-10-CM | POA: Diagnosis not present

## 2023-10-15 DIAGNOSIS — D225 Melanocytic nevi of trunk: Secondary | ICD-10-CM | POA: Diagnosis not present

## 2023-12-14 ENCOUNTER — Ambulatory Visit: Payer: Self-pay

## 2023-12-14 ENCOUNTER — Ambulatory Visit: Payer: Self-pay | Admitting: Physician Assistant

## 2023-12-14 ENCOUNTER — Ambulatory Visit: Admitting: Physician Assistant

## 2023-12-14 VITALS — BP 124/70 | HR 80 | Temp 97.8°F | Resp 16 | Ht 65.0 in | Wt 149.0 lb

## 2023-12-14 DIAGNOSIS — J069 Acute upper respiratory infection, unspecified: Secondary | ICD-10-CM

## 2023-12-14 DIAGNOSIS — J0110 Acute frontal sinusitis, unspecified: Secondary | ICD-10-CM

## 2023-12-14 LAB — POC COVID19 BINAXNOW: SARS Coronavirus 2 Ag: NEGATIVE

## 2023-12-14 LAB — POCT INFLUENZA A/B
Influenza A, POC: NEGATIVE
Influenza B, POC: NEGATIVE

## 2023-12-14 MED ORDER — AZITHROMYCIN 250 MG PO TABS: ORAL_TABLET | ORAL | 0 refills | Status: AC

## 2023-12-14 MED ORDER — PREDNISONE 20 MG PO TABS: ORAL_TABLET | ORAL | 0 refills | Status: AC

## 2023-12-14 NOTE — Progress Notes (Signed)
 Acute Office Visit  Subjective:    Patient ID: Kelsey Webb, female    DOB: 05/14/1945, 78 y.o.   MRN: [id number]  Chief Complaint  Patient presents with   Cough    HPI: Patient is in today for chronic cough  Discussed the use of AI scribe software for clinical note transcription with the patient, who gave verbal consent to proceed.  History of Present Illness Kelsey Webb is a 78 year old female who presents with a persistent cough and sinus issues.  She has experienced a persistent, deep cough since Sunday, which has affected her voice but has not caused vomiting. The cough is accompanied by nasal discharge, which she finds unpleasant. The symptoms have disrupted her sleep, leading to inadequate rest.  She has attempted to alleviate her symptoms with Nyquil, but it has not provided relief. She has not measured her temperature due to a misplaced thermometer, but she does not believe she has had a fever. No nausea or vomiting is present.  She reports ear pain and a salty taste at the tip of her tongue, which she noticed at the onset of her symptoms. Her sense of taste has been altered, with food lacking flavor.  She states that she normally does not have any allergies. She does not have any issues with prednisone and does not monitor her blood sugar levels regularly.     Past Medical History:  Diagnosis Date   Acute gastric ulcer with bleeding 05/26/2020   Gastric ulcer    Migraine    Wrist fracture     Past Surgical History:  Procedure Laterality Date   CATARACT EXTRACTION      Family History  Problem Relation Age of Onset   Other Mother        aging   Emphysema Father    Diabetes Sister     Social History   Socioeconomic History   Marital status: Married    Spouse name: Not on file   Number of children: 2   Years of education: Not on file   Highest education level: Some college, no degree  Occupational History   Occupation: Retired  Tobacco Use    Smoking status: Never   Smokeless tobacco: Never  Vaping Use   Vaping status: Never Used  Substance and Sexual Activity   Alcohol use: Not Currently   Drug use: Never   Sexual activity: Not Currently  Other Topics Concern   Not on file  Social History Narrative   Not on file   Social Drivers of Health   Financial Resource Strain: Low Risk  (12/14/2023)   Overall Financial Resource Strain (CARDIA)    Difficulty of Paying Living Expenses: Not hard at all  Food Insecurity: No Food Insecurity (12/14/2023)   Hunger Vital Sign    Worried About Running Out of Food in the Last Year: Never true    Ran Out of Food in the Last Year: Never true  Transportation Needs: No Transportation Needs (12/14/2023)   PRAPARE - Administrator, Civil Service (Medical): No    Lack of Transportation (Non-Medical): No  Physical Activity: Not on file  Stress: No Stress Concern Present (12/14/2023)   [employer] of Occupational Health - Occupational Stress Questionnaire    Feeling of Stress: Not at all  Social Connections: Moderately Integrated (12/14/2023)   Social Connection and Isolation Panel    Frequency of Communication with Friends and Family: More than three times a week    Frequency  of Social Gatherings with Friends and Family: More than three times a week    Attends Religious Services: 1 to 4 times per year    Active Member of [employer] or Organizations: No    Attends Engineer, structural: Not on file    Marital Status: Married  Catering manager Violence: Not At Risk (01/16/2023)   Humiliation, Afraid, Rape, and Kick questionnaire    Fear of Current or Ex-Partner: No    Emotionally Abused: No    Physically Abused: No    Sexually Abused: No    Outpatient Medications Prior to Visit  Medication Sig Dispense Refill   Biotin 1000 MCG tablet 1,000 mcg daily.     Calcium 500 MG tablet 500 mg daily.     Ferrous Sulfate (IRON) 325 (65 Fe) MG TABS Take 1 tablet by mouth 2  (two) times a week.     Multiple Vitamins-Minerals (MULTIVITAMIN ADULTS PO) Take 1 tablet by mouth daily.     No facility-administered medications prior to visit.    Allergies  Allergen Reactions   Nsaids Hives   Amoxicillin-Pot Clavulanate Itching, Other (See Comments) and Rash    Other Reaction(s): rash/elev LFTs    Review of Systems  Constitutional:  Positive for fatigue. Negative for chills and fever.  HENT:  Positive for postnasal drip, sinus pressure, sinus pain and sore throat. Negative for congestion and ear pain.   Respiratory:  Positive for cough. Negative for shortness of breath.   Cardiovascular:  Negative for chest pain and palpitations.  Gastrointestinal:  Negative for abdominal pain, constipation, diarrhea, nausea and vomiting.  Endocrine: Negative for polydipsia, polyphagia and polyuria.  Genitourinary:  Negative for difficulty urinating and dysuria.  Musculoskeletal:  Negative for arthralgias, back pain and myalgias.  Skin:  Negative for rash.  Neurological:  Negative for headaches.  Psychiatric/Behavioral:  Negative for dysphoric mood. The patient is not nervous/anxious.        Objective:        12/14/2023   10:02 AM 01/16/2023    9:03 AM 01/06/2022    8:40 AM  Vitals with BMI  Height 5' 5 5' 5 5' 5  Weight 149 lbs 146 lbs 148 lbs  BMI 24.79 24.3 24.63  Systolic 124 118 881  Diastolic 70 74 70  Pulse 80 71 73    No data found.   Physical Exam Vitals reviewed.  Constitutional:      Appearance: Normal appearance.  HENT:     Right Ear: Hearing and ear canal normal. A middle ear effusion is present. Tympanic membrane is bulging.     Left Ear: Hearing and ear canal normal. A middle ear effusion is present. Tympanic membrane is bulging.     Nose:     Right Sinus: Frontal sinus tenderness present.     Left Sinus: Frontal sinus tenderness present.  Cardiovascular:     Rate and Rhythm: Normal rate and regular rhythm.     Heart sounds: Normal  heart sounds.  Pulmonary:     Effort: Pulmonary effort is normal.     Breath sounds: Normal breath sounds.  Abdominal:     General: Bowel sounds are normal.     Palpations: Abdomen is soft.     Tenderness: There is no abdominal tenderness.  Neurological:     Mental Status: She is alert and oriented to person, place, and time.  Psychiatric:        Mood and Affect: Mood normal.  Behavior: Behavior normal.     Health Maintenance Due  Topic Date Due   Hepatitis C Screening  Never done   DEXA SCAN  Never done   Influenza Vaccine  09/21/2023   COVID-19 Vaccine (4 - 2025-26 season) 10/22/2023   Medicare Annual Wellness (AWV)  01/16/2024    There are no preventive care reminders to display for this patient.   Lab Results  Component Value Date   TSH 1.760 12/02/2020   Lab Results  Component Value Date   WBC 5.3 01/06/2022   HGB 15.0 01/06/2022   HCT 46.8 (H) 01/06/2022   MCV 94 01/06/2022   PLT 327 01/06/2022   Lab Results  Component Value Date   NA 139 01/16/2023   K 5.0 01/16/2023   CO2 25 01/16/2023   GLUCOSE 98 01/16/2023   BUN 13 01/16/2023   CREATININE 1.07 (H) 01/16/2023   BILITOT 0.5 01/16/2023   ALKPHOS 91 01/16/2023   AST 24 01/16/2023   ALT 13 01/16/2023   PROT 6.9 01/16/2023   ALBUMIN 4.3 01/16/2023   CALCIUM 9.5 01/16/2023   ANIONGAP 7 05/12/2021   EGFR 54 (L) 01/16/2023   Lab Results  Component Value Date   CHOL 268 (H) 01/16/2023   Lab Results  Component Value Date   HDL 94 01/16/2023   Lab Results  Component Value Date   LDLCALC 154 (H) 01/16/2023   Lab Results  Component Value Date   TRIG 120 01/16/2023   Lab Results  Component Value Date   CHOLHDL 2.9 01/16/2023   No results found for: HGBA1C      Results for orders placed or performed in visit on 12/14/23  POCT Influenza A/B   Collection Time: 12/14/23 10:21 AM  Result Value Ref Range   Influenza A, POC Negative Negative   Influenza B, POC Negative Negative  POC  COVID-19 BinaxNow   Collection Time: 12/14/23 10:21 AM  Result Value Ref Range   SARS Coronavirus 2 Ag Negative Negative     Assessment & Plan:   Assessment & Plan Acute non-recurrent frontal sinusitis Acute sinusitis with cough Sinus infection causing cough and altered taste, no lower respiratory infection. - Prescribed azithromycin for sinus infection. - Prescribed prednisone taper for inflammation and drainage. - Sent prescriptions to [facility] in [place]. - Advised monitoring taste sensation and to return if abnormal taste persists post-infection. - Encouraged hydration and rest. Orders:   azithromycin (ZITHROMAX) 250 MG tablet; Take 2 tablets on day 1, then 1 tablet daily on days 2 through 5   predniSONE (DELTASONE) 20 MG tablet; Take 3 tablets (60 mg total) by mouth daily with breakfast for 3 days, THEN 2 tablets (40 mg total) daily with breakfast for 3 days, THEN 1 tablet (20 mg total) daily with breakfast for 3 days.  Acute URI Continue to monitor symptoms Tests came back negative Will adjust treatment if cough continues Orders:   POCT Influenza A/B   POC COVID-19 BinaxNow    Body mass index is 24.79 kg/m..   Meds ordered this encounter  Medications   azithromycin (ZITHROMAX) 250 MG tablet    Sig: Take 2 tablets on day 1, then 1 tablet daily on days 2 through 5    Dispense:  6 tablet    Refill:  0   predniSONE (DELTASONE) 20 MG tablet    Sig: Take 3 tablets (60 mg total) by mouth daily with breakfast for 3 days, THEN 2 tablets (40 mg total) daily with breakfast for 3  days, THEN 1 tablet (20 mg total) daily with breakfast for 3 days.    Dispense:  18 tablet    Refill:  0    Orders Placed This Encounter  Procedures   POCT Influenza A/B   POC COVID-19 BinaxNow     Follow-up: Return if symptoms worsen or fail to improve.  An After Visit Summary was printed and given to the patient.  Nola Angles, [place] [facility] [phone]

## 2023-12-14 NOTE — Telephone Encounter (Signed)
 FYI Only or Action Required?: FYI only for provider.  Patient was last seen in primary care on 01/16/2023 by Sirivol, Mamatha, MD.  Called Nurse Triage reporting Cough.  Symptoms began several days ago.  Interventions attempted: Nothing.  Symptoms are: gradually worsening.  Triage Disposition: See Physician Within 24 Hours  Patient/caregiver understands and will follow disposition?: Yes     Copied from CRM #[id number]. Topic: Clinical - Red Word Triage >> Dec 14, 2023  8:40 AM Emylou G wrote: [facility] that prompted transfer to Nurse Triage: really bad cough since Sunday.. nose is stopped up and won't stop running, voice.. can't taste ( weird taste in her mouth ) unsure on her fever.. cough persistant Reason for Disposition  [1] MILD difficulty breathing (e.g., minimal/no SOB at rest, SOB with walking, pulse < 100) AND [2] still present when not coughing    States she has difficulty breathing with a severe cough.  Answer Assessment - Initial Assessment Questions 1. ONSET: When did the cough begin?      Sunday  2. SEVERITY: How bad is the cough today?      Deep severe cough  3. SPUTUM: Describe the color of your sputum (e.g., none, dry cough; clear, white, yellow, green)     Dry  4. HEMOPTYSIS: Are you coughing up any blood? If Yes, ask: How much? (e.g., flecks, streaks, tablespoons, etc.)     Denies  5. DIFFICULTY BREATHING: Are you having difficulty breathing? If Yes, ask: How bad is it? (e.g., mild, moderate, severe)      Severe  6. FEVER: Do you have a fever? If Yes, ask: What is your temperature, how was it measured, and when did it start?     Unsure unable to take temperature  7. CARDIAC HISTORY: Do you have any history of heart disease? (e.g., heart attack, congestive heart failure)      Denies  8. LUNG HISTORY: Do you have any history of lung disease?  (e.g., pulmonary embolus, asthma, emphysema)     Denies  9. PE RISK FACTORS: Do you have a  history of blood clots? (or: recent major surgery, recent prolonged travel, bedridden)     Denies  10. OTHER SYMPTOMS: Do you have any other symptoms? (e.g., runny nose, wheezing, chest pain)       Loss of voice, headache, runny nose, nasal congestion, and a weird taste in her mouth  Protocols used: Cough - Acute Productive-A-AH

## 2023-12-14 NOTE — Assessment & Plan Note (Signed)
 Acute sinusitis with cough Sinus infection causing cough and altered taste, no lower respiratory infection. - Prescribed azithromycin for sinus infection. - Prescribed prednisone taper for inflammation and drainage. - Sent prescriptions to [facility] in [place]. - Advised monitoring taste sensation and to return if abnormal taste persists post-infection. - Encouraged hydration and rest. Orders:   azithromycin (ZITHROMAX) 250 MG tablet; Take 2 tablets on day 1, then 1 tablet daily on days 2 through 5   predniSONE (DELTASONE) 20 MG tablet; Take 3 tablets (60 mg total) by mouth daily with breakfast for 3 days, THEN 2 tablets (40 mg total) daily with breakfast for 3 days, THEN 1 tablet (20 mg total) daily with breakfast for 3 days.

## 2023-12-14 NOTE — Assessment & Plan Note (Addendum)
 Continue to monitor symptoms Tests came back negative Will adjust treatment if cough continues Orders:   POCT Influenza A/B   POC COVID-19 BinaxNow

## 2024-01-23 ENCOUNTER — Ambulatory Visit: Payer: Medicare HMO | Admitting: Family Medicine

## 2024-01-23 ENCOUNTER — Encounter: Payer: Self-pay | Admitting: Family Medicine

## 2024-01-23 VITALS — BP 130/74 | HR 78 | Temp 98.2°F | Ht 65.0 in | Wt 149.0 lb

## 2024-01-23 DIAGNOSIS — E782 Mixed hyperlipidemia: Secondary | ICD-10-CM

## 2024-01-23 DIAGNOSIS — Z1159 Encounter for screening for other viral diseases: Secondary | ICD-10-CM | POA: Diagnosis not present

## 2024-01-23 DIAGNOSIS — Z862 Personal history of diseases of the blood and blood-forming organs and certain disorders involving the immune mechanism: Secondary | ICD-10-CM | POA: Diagnosis not present

## 2024-01-23 DIAGNOSIS — Z0000 Encounter for general adult medical examination without abnormal findings: Secondary | ICD-10-CM

## 2024-01-23 NOTE — Progress Notes (Unsigned)
 Chief Complaint  Patient presents with   Medicare Wellness     Subjective:   Kelsey Webb is a 78 y.o. female who presents for a Medicare Annual Wellness Visit.  Visit info / Clinical Intake: Medicare Wellness Visit Type:: Subsequent Annual Wellness Visit Persons participating in visit and providing information:: patient Medicare Wellness Visit Mode:: In-person (required for WTM) Interpreter Needed?: No Pre-visit prep was completed: no AWV questionnaire completed by patient prior to visit?: no Living arrangements:: lives with spouse/significant other Patient's Overall Health Status Rating: very good Typical amount of pain: none Does pain affect daily life?: no Are you currently prescribed opioids?: no  Dietary Habits and Nutritional Risks How many meals a day?: 3 Eats fruit and vegetables daily?: yes Most meals are obtained by: preparing own meals; eating out In the last 2 weeks, have you had any of the following?: none Diabetic:: no  Functional Status Activities of Daily Living (to include ambulation/medication): (Patient-Rptd) Independent Ambulation: (Patient-Rptd) Independent Medication Administration: Independent Home Management (perform basic housework or laundry): (Patient-Rptd) Independent Manage your own finances?: yes Primary transportation is: driving Concerns about vision?: no *vision screening is required for WTM* Concerns about hearing?: no  Fall Screening Falls in the past year?: (Patient-Rptd) 0 Number of falls in past year: 0 Was there an injury with Fall?: 0 Fall Risk Category Calculator: 0 Patient Fall Risk Level: Low Fall Risk  Fall Risk Patient at Risk for Falls Due to: No Fall Risks Fall risk Follow up: Falls evaluation completed  Home and Transportation Safety: All rugs have non-skid backing?: N/A, no rugs All stairs or steps have railings?: N/A, no stairs Grab bars in the bathtub or shower?: yes Have non-skid surface in bathtub or  shower?: yes Good home lighting?: yes Regular seat belt use?: yes Hospital stays in the last year:: no  Cognitive Assessment Difficulty concentrating, remembering, or making decisions? : no Will 6CIT or Mini Cog be Completed: no 6CIT or Mini Cog Declined: patient declined  Advance Directives (For Healthcare) Does Patient Have a Medical Advance Directive?: Yes Does patient want to make changes to medical advance directive?: No - Patient declined Type of Advance Directive: Healthcare Power of [place]; Living will Copy of Healthcare Power of Attorney in Chart?: No - copy requested Copy of Living Will in Chart?: No - copy requested  Reviewed/Updated  Reviewed/Updated: Reviewed All (Medical, Surgical, Family, Medications, Allergies, Care Teams, Patient Goals)    Allergies (verified) Nsaids and Amoxicillin-pot clavulanate   Current Medications (verified) Outpatient Encounter Medications as of 01/23/2024  Medication Sig   Biotin 1000 MCG tablet 1,000 mcg daily.   Calcium 500 MG tablet 500 mg daily.   Ferrous Sulfate (IRON) 325 (65 Fe) MG TABS Take 1 tablet by mouth 2 (two) times a week.   Multiple Vitamins-Minerals (MULTIVITAMIN ADULTS PO) Take 1 tablet by mouth daily.   No facility-administered encounter medications on file as of 01/23/2024.    History: Past Medical History:  Diagnosis Date   Acute gastric ulcer with bleeding 05/26/2020   Gastric ulcer    Migraine    Wrist fracture    Past Surgical History:  Procedure Laterality Date   CATARACT EXTRACTION     Family History  Problem Relation Age of Onset   Other Mother        aging   Emphysema Father    Diabetes Sister    Social History   Occupational History   Occupation: Retired  Tobacco Use   Smoking status: Never  Smokeless tobacco: Never  Vaping Use   Vaping status: Never Used  Substance and Sexual Activity   Alcohol use: Not Currently   Drug use: Never   Sexual activity: Not Currently   Tobacco  Counseling Counseling given: Not Answered  SDOH Screenings   Food Insecurity: No Food Insecurity (01/23/2024)  Housing: Low Risk  (01/23/2024)  Transportation Needs: No Transportation Needs (01/23/2024)  Utilities: Not At Risk (01/23/2024)  Alcohol Screen: Low Risk  (12/14/2023)  Depression (PHQ2-9): Low Risk  (01/23/2024)  Financial Resource Strain: Low Risk  (01/23/2024)  Physical Activity: Sufficiently Active (01/23/2024)  Social Connections: Moderately Integrated (01/23/2024)  Stress: No Stress Concern Present (01/23/2024)  Tobacco Use: Low Risk  (01/23/2024)  Health Literacy: Adequate Health Literacy (01/23/2024)   See flowsheets for full screening details  Depression Screen PHQ 2 & 9 Depression Scale- Over the past 2 weeks, how often have you been bothered by any of the following problems? Little interest or pleasure in doing things: 0 Feeling down, depressed, or hopeless (PHQ Adolescent also includes...irritable): 0 PHQ-2 Total Score: 0     Goals Addressed             This Visit's Progress    Exercise 3x per week (30 min per time)               Objective:    Today's Vitals   01/23/24 0915  BP: 130/74  Pulse: 78  Temp: 98.2 F (36.8 C)  SpO2: 98%  Weight: 149 lb (67.6 kg)  Height: 5' 5 (1.651 m)  PainSc: 0-No pain   Body mass index is 24.79 kg/m.  Hearing/Vision screen No results found. Immunizations and Health Maintenance Health Maintenance  Topic Date Due   Hepatitis C Screening  Never done   COVID-19 Vaccine (4 - 2025-26 season) 02/08/2024 (Originally 10/22/2023)   Bone Density Scan  01/22/2025 (Originally 05/23/2012)   Medicare Annual Wellness (AWV)  01/22/2025   DTaP/Tdap/Td (2 - Td or Tdap) 08/04/2028   Pneumococcal Vaccine: 50+ Years  Completed   Influenza Vaccine  Completed   Zoster Vaccines- Shingrix  Completed   Meningococcal B Vaccine  Aged Out        Assessment/Plan:  This is a routine wellness examination for Kelsey Webb.  Patient Care  Team: Sherre Clapper, MD as PCP - General (Family Medicine)  I have personally reviewed and noted the following in the patient's chart:   Medical and social history Use of alcohol, tobacco or illicit drugs  Current medications and supplements including opioid prescriptions. Functional ability and status Nutritional status Physical activity Advanced directives List of other physicians Hospitalizations, surgeries, and ER visits in previous 12 months Vitals Screenings to include cognitive, depression, and falls Referrals and appointments  No orders of the defined types were placed in this encounter.  In addition, I have reviewed and discussed with patient certain preventive protocols, quality metrics, and best practice recommendations. A written personalized care plan for preventive services as well as general preventive health recommendations were provided to patient.   Clapper Sherre, MD   01/23/2024   Return in 1 year (on 01/22/2025).  After Visit Summary: {CHL AMB AWV After Visit [phone]}  Nurse Notes: ***

## 2024-01-24 LAB — COMPREHENSIVE METABOLIC PANEL WITH GFR
ALT: 16 IU/L (ref 0–32)
AST: 26 IU/L (ref 0–40)
Albumin: 4.3 g/dL (ref 3.8–4.8)
Alkaline Phosphatase: 91 IU/L (ref 49–135)
BUN/Creatinine Ratio: 18 (ref 12–28)
BUN: 18 mg/dL (ref 8–27)
Bilirubin Total: 0.4 mg/dL (ref 0.0–1.2)
CO2: 25 mmol/L (ref 20–29)
Calcium: 9.9 mg/dL (ref 8.7–10.3)
Chloride: 98 mmol/L (ref 96–106)
Creatinine, Ser: 0.98 mg/dL (ref 0.57–1.00)
Globulin, Total: 2.6 g/dL (ref 1.5–4.5)
Glucose: 85 mg/dL (ref 70–99)
Potassium: 5.1 mmol/L (ref 3.5–5.2)
Sodium: 138 mmol/L (ref 134–144)
Total Protein: 6.9 g/dL (ref 6.0–8.5)
eGFR: 60 mL/min/1.73 (ref >59)

## 2024-01-24 LAB — CBC WITH DIFFERENTIAL/PLATELET
Basophils Absolute: 0.1 x10E3/uL (ref 0.0–0.2)
Basos: 1 %
EOS (ABSOLUTE): 0.1 x10E3/uL (ref 0.0–0.4)
Eos: 1 %
Hematocrit: 44.1 % (ref 34.0–46.6)
Hemoglobin: 14.1 g/dL (ref 11.1–15.9)
Immature Grans (Abs): 0 x10E3/uL (ref 0.0–0.1)
Immature Granulocytes: 0 %
Lymphocytes Absolute: 1.3 x10E3/uL (ref 0.7–3.1)
Lymphs: 25 %
MCH: 30.3 pg (ref 26.6–33.0)
MCHC: 32 g/dL (ref 31.5–35.7)
MCV: 95 fL (ref 79–97)
Monocytes Absolute: 0.5 x10E3/uL (ref 0.1–0.9)
Monocytes: 9 %
Neutrophils Absolute: 3.3 x10E3/uL (ref 1.4–7.0)
Neutrophils: 64 %
Platelets: 275 x10E3/uL (ref 150–450)
RBC: 4.65 x10E6/uL (ref 3.77–5.28)
RDW: 12.3 % (ref 11.7–15.4)
WBC: 5.1 x10E3/uL (ref 3.4–10.8)

## 2024-01-24 LAB — TSH: TSH: 2.32 u[IU]/mL (ref 0.450–4.500)

## 2024-01-24 LAB — HCV INTERPRETATION

## 2024-01-24 LAB — HCV AB W REFLEX TO QUANT PCR: HCV Ab: NONREACTIVE

## 2024-01-25 ENCOUNTER — Ambulatory Visit: Payer: Self-pay | Admitting: Family Medicine

## 2024-07-23 ENCOUNTER — Ambulatory Visit: Admitting: Family Medicine

## 2025-02-04 ENCOUNTER — Ambulatory Visit
